# Patient Record
Sex: Male | Born: 1965
Health system: Southern US, Community
[De-identification: ages and names within clinical notes are randomized; demographics above are authoritative.]

## PROBLEM LIST (undated history)

## (undated) DIAGNOSIS — F419 Anxiety disorder, unspecified: Secondary | ICD-10-CM

## (undated) DIAGNOSIS — K829 Disease of gallbladder, unspecified: Secondary | ICD-10-CM

## (undated) DIAGNOSIS — K449 Diaphragmatic hernia without obstruction or gangrene: Secondary | ICD-10-CM

## (undated) DIAGNOSIS — J45909 Unspecified asthma, uncomplicated: Secondary | ICD-10-CM

## (undated) DIAGNOSIS — M1990 Unspecified osteoarthritis, unspecified site: Secondary | ICD-10-CM

## (undated) DIAGNOSIS — G4730 Sleep apnea, unspecified: Secondary | ICD-10-CM

## (undated) DIAGNOSIS — S0990XA Unspecified injury of head, initial encounter: Secondary | ICD-10-CM

## (undated) HISTORY — DX: Unspecified injury of head, initial encounter: S09.90XA

## (undated) HISTORY — DX: Disease of gallbladder, unspecified: K82.9

## (undated) HISTORY — DX: Unspecified osteoarthritis, unspecified site: M19.90

## (undated) HISTORY — PX: HERNIA REPAIR: SHX51

## (undated) HISTORY — DX: Unspecified asthma, uncomplicated: J45.909

## (undated) HISTORY — DX: Diaphragmatic hernia without obstruction or gangrene: K44.9

## (undated) HISTORY — PX: KNEE SURGERY: SHX244

## (undated) HISTORY — DX: Anxiety disorder, unspecified: F41.9

## (undated) HISTORY — DX: Sleep apnea, unspecified: G47.30

## (undated) HISTORY — PX: CHOLECYSTECTOMY: SHX55

---

## 2007-02-07 ENCOUNTER — Emergency Department: Payer: Self-pay | Admitting: Emergency Medicine

## 2010-03-06 ENCOUNTER — Emergency Department: Payer: Self-pay | Admitting: Emergency Medicine

## 2010-07-07 ENCOUNTER — Inpatient Hospital Stay: Payer: Self-pay | Admitting: Surgery

## 2014-05-14 ENCOUNTER — Emergency Department: Payer: Self-pay | Admitting: Emergency Medicine

## 2014-05-14 LAB — CBC WITH DIFFERENTIAL/PLATELET
BASOS ABS: 0 10*3/uL (ref 0.0–0.1)
Basophil %: 0.3 %
Eosinophil #: 0.1 10*3/uL (ref 0.0–0.7)
Eosinophil %: 0.9 %
HCT: 43.5 % (ref 40.0–52.0)
HGB: 14.7 g/dL (ref 13.0–18.0)
LYMPHS ABS: 2 10*3/uL (ref 1.0–3.6)
Lymphocyte %: 15 %
MCH: 32.6 pg (ref 26.0–34.0)
MCHC: 33.8 g/dL (ref 32.0–36.0)
MCV: 97 fL (ref 80–100)
MONO ABS: 1.1 x10 3/mm — AB (ref 0.2–1.0)
Monocyte %: 7.9 %
NEUTROS ABS: 10.3 10*3/uL — AB (ref 1.4–6.5)
NEUTROS PCT: 75.9 %
PLATELETS: 385 10*3/uL (ref 150–440)
RBC: 4.51 10*6/uL (ref 4.40–5.90)
RDW: 12.7 % (ref 11.5–14.5)
WBC: 13.5 10*3/uL — ABNORMAL HIGH (ref 3.8–10.6)

## 2014-05-14 LAB — COMPREHENSIVE METABOLIC PANEL
ALK PHOS: 81 U/L
ALT: 73 U/L — AB
ANION GAP: 10 (ref 7–16)
Albumin: 4.2 g/dL (ref 3.4–5.0)
BUN: 14 mg/dL (ref 7–18)
Bilirubin,Total: 0.2 mg/dL (ref 0.2–1.0)
CALCIUM: 8.4 mg/dL — AB (ref 8.5–10.1)
Chloride: 103 mmol/L (ref 98–107)
Co2: 25 mmol/L (ref 21–32)
Creatinine: 1.1 mg/dL (ref 0.60–1.30)
EGFR (African American): >60
EGFR (Non-African Amer.): >60
Glucose: 85 mg/dL (ref 65–99)
OSMOLALITY: 275 (ref 275–301)
POTASSIUM: 3.9 mmol/L (ref 3.5–5.1)
SGOT(AST): 37 U/L (ref 15–37)
Sodium: 138 mmol/L (ref 136–145)
TOTAL PROTEIN: 7.4 g/dL (ref 6.4–8.2)

## 2014-05-14 LAB — URINALYSIS, COMPLETE
BACTERIA: NONE SEEN
BLOOD: NEGATIVE
Bilirubin,UR: NEGATIVE
Glucose,UR: NEGATIVE mg/dL (ref 0–75)
Ketone: NEGATIVE
LEUKOCYTE ESTERASE: NEGATIVE
Nitrite: NEGATIVE
Ph: 5 (ref 4.5–8.0)
Protein: NEGATIVE
SPECIFIC GRAVITY: 1.019 (ref 1.003–1.030)
Squamous Epithelial: <1
WBC UR: 2 /HPF (ref 0–5)

## 2014-05-14 LAB — TROPONIN I

## 2014-05-14 LAB — LIPASE, BLOOD: LIPASE: 136 U/L (ref 73–393)

## 2014-05-14 LAB — D-DIMER(ARMC): D-Dimer: 364 ng/ml

## 2015-04-13 ENCOUNTER — Telehealth: Payer: Self-pay | Admitting: Pain Medicine

## 2015-04-13 ENCOUNTER — Encounter: Payer: Self-pay | Admitting: Pain Medicine

## 2015-04-13 NOTE — Telephone Encounter (Signed)
Patient is out of town on Monday 04-13-15 will need to resched

## 2015-04-14 ENCOUNTER — Encounter: Payer: Self-pay | Admitting: Pain Medicine

## 2015-04-14 NOTE — Telephone Encounter (Signed)
1. No need to inform me if the patient wants to reschedule an appointment. Please provide the patient with the most convenient alternative.  2. Please reschedule any patients calling us to inform us that they are sick. Please remember that all of our procedures are elective. Preferably, schedule the patient to return in 14-20 days.

## 2015-04-21 ENCOUNTER — Ambulatory Visit: Payer: Medicaid Other | Attending: Pain Medicine | Admitting: Pain Medicine

## 2015-04-21 ENCOUNTER — Encounter: Payer: Self-pay | Admitting: Pain Medicine

## 2015-04-21 VITALS — BP 133/81 | HR 78 | Temp 98.2°F | Resp 18 | Ht 72.0 in | Wt 249.0 lb

## 2015-04-21 DIAGNOSIS — K5900 Constipation, unspecified: Secondary | ICD-10-CM | POA: Diagnosis not present

## 2015-04-21 DIAGNOSIS — Z5181 Encounter for therapeutic drug level monitoring: Secondary | ICD-10-CM

## 2015-04-21 DIAGNOSIS — G8929 Other chronic pain: Secondary | ICD-10-CM | POA: Diagnosis not present

## 2015-04-21 DIAGNOSIS — F329 Major depressive disorder, single episode, unspecified: Secondary | ICD-10-CM | POA: Diagnosis not present

## 2015-04-21 DIAGNOSIS — M1710 Unilateral primary osteoarthritis, unspecified knee: Secondary | ICD-10-CM

## 2015-04-21 DIAGNOSIS — M129 Arthropathy, unspecified: Secondary | ICD-10-CM | POA: Diagnosis not present

## 2015-04-21 DIAGNOSIS — M545 Low back pain, unspecified: Secondary | ICD-10-CM

## 2015-04-21 DIAGNOSIS — F1120 Opioid dependence, uncomplicated: Secondary | ICD-10-CM | POA: Insufficient documentation

## 2015-04-21 DIAGNOSIS — M170 Bilateral primary osteoarthritis of knee: Secondary | ICD-10-CM | POA: Insufficient documentation

## 2015-04-21 DIAGNOSIS — M25562 Pain in left knee: Secondary | ICD-10-CM | POA: Diagnosis present

## 2015-04-21 DIAGNOSIS — M12869 Other specific arthropathies, not elsewhere classified, unspecified knee: Secondary | ICD-10-CM | POA: Diagnosis not present

## 2015-04-21 DIAGNOSIS — F32A Depression, unspecified: Secondary | ICD-10-CM | POA: Insufficient documentation

## 2015-04-21 DIAGNOSIS — F1190 Opioid use, unspecified, uncomplicated: Secondary | ICD-10-CM | POA: Diagnosis not present

## 2015-04-21 DIAGNOSIS — Z79891 Long term (current) use of opiate analgesic: Secondary | ICD-10-CM | POA: Insufficient documentation

## 2015-04-21 DIAGNOSIS — K5909 Other constipation: Secondary | ICD-10-CM | POA: Insufficient documentation

## 2015-04-21 DIAGNOSIS — M25561 Pain in right knee: Secondary | ICD-10-CM | POA: Diagnosis present

## 2015-04-21 DIAGNOSIS — M174 Other bilateral secondary osteoarthritis of knee: Secondary | ICD-10-CM

## 2015-04-21 MED ORDER — OXYCODONE HCL 5 MG PO TABS: 5.0000 mg | ORAL_TABLET | Freq: Four times a day (QID) | ORAL | Status: DC | PRN

## 2015-04-21 MED ORDER — GABAPENTIN 300 MG PO CAPS
300.0000 mg | ORAL_CAPSULE | Freq: Every day | ORAL | Status: DC
Start: 2015-04-21 — End: 2015-05-20

## 2015-04-21 MED ORDER — MELOXICAM 15 MG PO TABS: 15.0000 mg | ORAL_TABLET | Freq: Every day | ORAL | Status: DC

## 2015-04-21 NOTE — Progress Notes (Signed)
Hydrocodone #19 wasted in toilet with [place] and patient

## 2015-04-21 NOTE — Progress Notes (Signed)
Patient's Name: Nathan Hood MRN: [id number] DOB: 02/06/1966 DOS: 04/21/2015  Primary Reason(s) for Visit: Encounter for Medication Management. CC: Knee Pain   HPI:   Nathan Hood is a 49 y.o. year old, male patient, who returns today as an established patient. He has Chronic constipation; Chronic low back pain; Other bilateral secondary osteoarthritis of knee; Depression; Knee arthropathy; Encounter for therapeutic drug level monitoring; Long term current use of opiate analgesic; Uncomplicated opioid dependence ([place]); Opiate use; and Chronic pain on his problem list.. His primarily concern today is the Knee Pain    The patient indicates that recently he had intra-articular injections of Synvisc on both knees. He had all 3 injections and he feels that he did not get any benefit from them. At this point I have to assume that he has no more cartilage left. I asked him when his last MRI was in indicated that he was between 2008 and 2011. He indicates that his condition has worsened significantly since then. In view of this I will be repeating the MRI of both of his knees. I have also offered him genicular nerve blocks and if effective, we will consider RFA.  Pharmacotherapy Review: Side-effects or Adverse reactions: None reported. Effectiveness: Described as ineffective and would like to make some changes. Onset of action: Within expected pharmacological parameters. Duration of action: Within normal limits for medication. Peak effect: Timing and results are as within normal expected parameters. [place] PMP: Compliant with practice rules and regulations. DST: Compliant with practice rules and regulations. Lab work: No new labs ordered by our practice. Treatment compliance: Compliant. Substance Use Disorder (SUD) Risk Level: Low Planned course of action: Adjust therapy. Today we will discontinue the hydrocodone and start him on oxycodone.  Allergies: Nathan Hood has No Known  Allergies.  Meds: The patient has a current medication list which includes the following prescription(s): ibuprofen, meloxicam, terbinafine, gabapentin, and oxycodone. Requested Prescriptions   Signed Prescriptions Disp Refills  . oxyCODONE (OXY IR/ROXICODONE) 5 MG immediate release tablet 120 tablet 0    Sig: Take 1 tablet (5 mg total) by mouth every 6 (six) hours as needed for severe pain.  Marland Kitchen gabapentin (NEURONTIN) 300 MG capsule 30 capsule 0    Sig: Take 1 capsule (300 mg total) by mouth at bedtime.  . meloxicam (MOBIC) 15 MG tablet 30 tablet 0    Sig: Take 1 tablet (15 mg total) by mouth daily.    ROS: Constitutional: Afebrile, no chills, well hydrated and well nourished Gastrointestinal: negative Musculoskeletal:negative Neurological: negative Behavioral/Psych: negative  PFSH: Medical:  Nathan Hood  has a past medical history of Head injury; Asthma; Sleep apnea; Hiatal hernia; Gallbladder disease; Arthritis; and Anxiety. Family: family history includes Arthritis in his mother; Birth defects in his maternal grandfather; COPD in his mother; Stroke in his maternal grandfather. Surgical:  has past surgical history that includes Hernia repair; Knee surgery (Right); and Cholecystectomy. Tobacco:  reports that he has never smoked. He does not have any smokeless tobacco history on file. Alcohol:  reports that he drinks about 3.6 oz of alcohol per week. Drug:  reports that he does not use illicit drugs.  Physical Exam: Vitals:  Today's Vitals   04/21/15 1520 04/21/15 1522  BP:  133/81  Pulse: 78   Temp: 98.2 F (36.8 C)   Resp: 18   Height: 6' (1.829 m)   Weight: 249 lb (112.946 kg)   SpO2: 99%   PainSc: 9  6   Calculated BMI: Body mass  index is 33.76 kg/(m^2). General appearance: alert, cooperative, no distress and moderately obese Eyes: conjunctivae/corneas clear. PERRL, EOM's intact. Fundi benign. Lungs: No evidence respiratory distress, no audible rales or ronchi and  no use of accessory muscles of respiration Neck: no adenopathy, no carotid bruit, no JVD, supple, symmetrical, trachea midline and thyroid not enlarged, symmetric, no tenderness/mass/nodules Back: symmetric, no curvature. ROM normal. No CVA tenderness. Extremities: extremities normal, atraumatic, no cyanosis or edema Pulses: 2+ and symmetric Skin: Skin color, texture, turgor normal. No rashes or lesions Neurologic: Gait: Antalgic    Assessment: Encounter Diagnosis:  Primary Diagnosis: Knee arthropathy [M12.9]  Plan: Nathan Hood was seen today for knee pain.  Diagnoses and all orders for this visit:  Knee arthropathy -     GENICULAR NERVE BLOCK; Future -     MR Knee Left  Wo Contrast; Future -     MR Knee Right Wo Contrast; Future  Chronic pain -     oxyCODONE (OXY IR/ROXICODONE) 5 MG immediate release tablet; Take 1 tablet (5 mg total) by mouth every 6 (six) hours as needed for severe pain. -     gabapentin (NEURONTIN) 300 MG capsule; Take 1 capsule (300 mg total) by mouth at bedtime. -     meloxicam (MOBIC) 15 MG tablet; Take 1 tablet (15 mg total) by mouth daily.  Opiate use  Uncomplicated opioid dependence ([place])  Long term current use of opiate analgesic -     Drugs of abuse screen w/o alc, rtn urine-sln; Standing  Encounter for therapeutic drug level monitoring  Depression  Other bilateral secondary osteoarthritis of knee  Chronic low back pain  Chronic constipation     There are no Patient Instructions on file for this visit. Medications discontinued today:  Medications Discontinued During This Encounter  Medication Reason  . HYDROcodone-acetaminophen (NORCO/VICODIN) 5-325 MG tablet Discontinued by provider  . meloxicam (MOBIC) 15 MG tablet Reorder   Medications administered today:  Nathan Hood had no medications administered during this visit.  Primary Care Physician: No primary care provider on file. Location: [facility] Note  by: Jovon Streetman A. Dossie Arbour, M.D, DABA, DABAPM, DABPM, DABIPP, FIPP

## 2015-04-21 NOTE — Progress Notes (Signed)
Safety precautions to be maintained throughout the outpatient stay will include: orient to surroundings, keep bed in low position, maintain call bell within reach at all times, provide assistance with transfer out of bed and ambulation. Hydrocodone pill count #19/120

## 2015-05-11 ENCOUNTER — Other Ambulatory Visit: Payer: Self-pay | Admitting: Pain Medicine

## 2015-05-19 ENCOUNTER — Other Ambulatory Visit: Payer: Self-pay | Admitting: Pain Medicine

## 2015-05-20 ENCOUNTER — Encounter: Payer: Self-pay | Admitting: Pain Medicine

## 2015-05-20 ENCOUNTER — Ambulatory Visit: Payer: Medicaid Other | Attending: Pain Medicine | Admitting: Pain Medicine

## 2015-05-20 VITALS — BP 134/90 | HR 83 | Temp 97.7°F | Resp 16 | Ht 72.0 in | Wt 248.0 lb

## 2015-05-20 DIAGNOSIS — M25561 Pain in right knee: Secondary | ICD-10-CM

## 2015-05-20 DIAGNOSIS — F1190 Opioid use, unspecified, uncomplicated: Secondary | ICD-10-CM | POA: Insufficient documentation

## 2015-05-20 DIAGNOSIS — F419 Anxiety disorder, unspecified: Secondary | ICD-10-CM | POA: Diagnosis not present

## 2015-05-20 DIAGNOSIS — K5909 Other constipation: Secondary | ICD-10-CM | POA: Diagnosis not present

## 2015-05-20 DIAGNOSIS — M1710 Unilateral primary osteoarthritis, unspecified knee: Secondary | ICD-10-CM

## 2015-05-20 DIAGNOSIS — K5900 Constipation, unspecified: Secondary | ICD-10-CM

## 2015-05-20 DIAGNOSIS — J45909 Unspecified asthma, uncomplicated: Secondary | ICD-10-CM | POA: Diagnosis not present

## 2015-05-20 DIAGNOSIS — M170 Bilateral primary osteoarthritis of knee: Secondary | ICD-10-CM | POA: Diagnosis not present

## 2015-05-20 DIAGNOSIS — Z79899 Other long term (current) drug therapy: Secondary | ICD-10-CM

## 2015-05-20 DIAGNOSIS — Z5181 Encounter for therapeutic drug level monitoring: Secondary | ICD-10-CM

## 2015-05-20 DIAGNOSIS — M129 Arthropathy, unspecified: Secondary | ICD-10-CM

## 2015-05-20 DIAGNOSIS — G8929 Other chronic pain: Secondary | ICD-10-CM | POA: Insufficient documentation

## 2015-05-20 DIAGNOSIS — M545 Low back pain: Secondary | ICD-10-CM | POA: Insufficient documentation

## 2015-05-20 DIAGNOSIS — M174 Other bilateral secondary osteoarthritis of knee: Secondary | ICD-10-CM

## 2015-05-20 DIAGNOSIS — R52 Pain, unspecified: Secondary | ICD-10-CM | POA: Diagnosis present

## 2015-05-20 DIAGNOSIS — Z79891 Long term (current) use of opiate analgesic: Secondary | ICD-10-CM

## 2015-05-20 DIAGNOSIS — M25562 Pain in left knee: Secondary | ICD-10-CM

## 2015-05-20 DIAGNOSIS — F1120 Opioid dependence, uncomplicated: Secondary | ICD-10-CM

## 2015-05-20 MED ORDER — GABAPENTIN 300 MG PO CAPS: 600.0000 mg | ORAL_CAPSULE | Freq: Every day | ORAL | Status: DC

## 2015-05-20 MED ORDER — MELOXICAM 15 MG PO TABS: 15.0000 mg | ORAL_TABLET | Freq: Every day | ORAL | Status: DC

## 2015-05-20 MED ORDER — OXYCODONE HCL 5 MG PO TABS: 5.0000 mg | ORAL_TABLET | Freq: Four times a day (QID) | ORAL | Status: DC | PRN

## 2015-05-20 NOTE — Patient Instructions (Signed)
A prescription for MELOXICAM, GABAPENTIN was sent to your pharmacy and should be available for pickup today.  A prescription for OXYCODONE was given to you today.

## 2015-05-20 NOTE — Progress Notes (Signed)
Patient's Name: Nathan Hood MRN: [id number] DOB: 1965/11/05 DOS: 05/20/2015  Primary Reason(s) for Visit: Encounter for Medication Management. CC: Pain   HPI:   Nathan Hood is a 49 y.o. year old, male patient, who returns today as an established patient. He has Chronic constipation; Chronic low back pain; Depression; Knee arthropathy; Encounter for therapeutic drug level monitoring; Long term current use of opiate analgesic; Uncomplicated opioid dependence ([place]); Opiate use; Chronic pain; Long term prescription opiate use; Chronic bilateral knee pain; and Bilateral knee osteoarthritis on his problem list.. His primarily concern today is the Pain     The patient returns today for follow-up evaluation. Unfortunately, the MRIs that we ordered have not been done. This appears to be an issue with Medicaid. Today we will go ahead and order some x-rays of the knees to see if what holding this is the fact that we don't have any recent x-rays. The patient did have some x-rays done, but he indicates that they were done in 2008.  Today's Pain Score: 8 . Clinically he looks like at 2-3/10. Reported level of pain is incompatible with clinical obrservations. This may be secondary to a possible lack of understanding on how the pain scale works. Pain Type: Chronic pain Pain Location: Knee Pain Orientation: Right, Left Pain Descriptors / Indicators: Aching Pain Frequency: Constant  Date of Last Visit: Date of Last Visit: 04/21/15 Service Provided on Last Visit: Service Provided on Last Visit: Med Refill  Pharmacotherapy Review:   Side-effects or Adverse reactions: None reported. Effectiveness: Described as relatively effective, allowing for increase in activities of daily living (ADL). Onset of action: Within expected pharmacological parameters. Duration of action: Within normal limits for medication. Peak effect: Timing and results are as within normal expected parameters. Nathan Hood PMP: Compliant with  practice rules and regulations. Last UDS available in the system: UDS: Compliant with practice rules and regulations. Lab work: No new labs ordered by our practice. Treatment compliance: Compliant. Substance Use Disorder (SUD) Risk Level: Low Planned course of action: Today we will adjust the dose on his gabapentin to 600 mg by mouth at bedtime.  We have offered to this patient diagnostic genicular nerve blocks, but he is still thinking about it.  Allergies: Nathan Hood has No Known Allergies.  Meds: The patient has a current medication list which includes the following prescription(s): gabapentin, ibuprofen, meloxicam, oxycodone, and terbinafine. Requested Prescriptions   Signed Prescriptions Disp Refills  . gabapentin (NEURONTIN) 300 MG capsule 60 capsule 0    Sig: Take 2 capsules (600 mg total) by mouth at bedtime.  . meloxicam (MOBIC) 15 MG tablet 30 tablet 0    Sig: Take 1 tablet (15 mg total) by mouth daily.  Marland Kitchen oxyCODONE (OXY IR/ROXICODONE) 5 MG immediate release tablet 120 tablet 0    Sig: Take 1 tablet (5 mg total) by mouth every 6 (six) hours as needed for severe pain.    ROS: Constitutional: Afebrile, no chills, well hydrated and well nourished Gastrointestinal: negative Musculoskeletal:negative Neurological: negative Behavioral/Psych: negative  PFSH: Medical:  Nathan Hood  has a past medical history of Head injury; Asthma; Sleep apnea; Hiatal hernia; Gallbladder disease; Arthritis; and Anxiety. Family: family history includes Arthritis in his mother; Birth defects in his maternal grandfather; COPD in his mother; Stroke in his maternal grandfather. Surgical:  has past surgical history that includes Hernia repair; Knee surgery (Right); and Cholecystectomy. Tobacco:  reports that he has never smoked. He does not have any smokeless tobacco history on file. Alcohol:  reports that he drinks about 3.6 oz of alcohol per week. Drug:  reports that he does not use illicit  drugs.  Physical Exam: Vitals:  Today's Vitals   05/20/15 1339  BP: 134/90  Pulse: 83  Temp: 97.7 F (36.5 C)  TempSrc: Oral  Resp: 16  Height: 6' (1.829 m)  Weight: 248 lb (112.492 kg)  SpO2: 99%  PainSc: 8   PainLoc: Knee  Calculated BMI: Body mass index is 33.63 kg/(m^2). General appearance: alert, cooperative, appears stated age and no distress Eyes: conjunctivae/corneas clear. PERRL, EOM's intact. Fundi benign. Lungs: No evidence respiratory distress, no audible rales or ronchi and no use of accessory muscles of respiration Neck: no adenopathy, no carotid bruit, no JVD, supple, symmetrical, trachea midline and thyroid not enlarged, symmetric, no tenderness/mass/nodules Back: symmetric, no curvature. ROM normal. No CVA tenderness. Extremities: extremities normal, atraumatic, no cyanosis or edema Pulses: 2+ and symmetric Skin: Skin color, texture, turgor normal. No rashes or lesions Neurologic: Grossly normal    Assessment: Encounter Diagnosis:  Primary Diagnosis: Primary osteoarthritis of both knees [M17.0]  Plan: [name] was seen today for pain.  Diagnoses and all orders for this visit:  Primary osteoarthritis of both knees  Long term prescription opiate use  Bilateral chronic knee pain -     DG Knee Complete 4 Views Right; Future -     DG Knee Complete 4 Views Left; Future  Chronic pain -     COMPLETE METABOLIC PANEL WITH GFR; Future -     C-reactive protein; Future -     Magnesium; Future -     Sedimentation rate; Future -     Vitamin D2,D3 Panel; Future -     gabapentin (NEURONTIN) 300 MG capsule; Take 2 capsules (600 mg total) by mouth at bedtime. -     meloxicam (MOBIC) 15 MG tablet; Take 1 tablet (15 mg total) by mouth daily. -     oxyCODONE (OXY IR/ROXICODONE) 5 MG immediate release tablet; Take 1 tablet (5 mg total) by mouth every 6 (six) hours as needed for severe pain.  Chronic low back pain  Knee arthropathy  Other bilateral secondary  osteoarthritis of knee  Encounter for therapeutic drug level monitoring  Long term current use of opiate analgesic  Opiate use  Uncomplicated opioid dependence ([place])  Chronic constipation     Patient Instructions  A prescription for MELOXICAM, GABAPENTIN was sent to your pharmacy and should be available for pickup today.  A prescription for OXYCODONE was given to you today.   Medications discontinued today:  Medications Discontinued During This Encounter  Medication Reason  . gabapentin (NEURONTIN) 300 MG capsule Reorder  . meloxicam (MOBIC) 15 MG tablet Reorder  . oxyCODONE (OXY IR/ROXICODONE) 5 MG immediate release tablet Reorder   Medications administered today:  Mr. Wickes had no medications administered during this visit.  Primary Care Physician: No primary care provider on file. Location: [facility] Note by: Kindrick Lankford A. Dossie Arbour, M.D, DABA, DABAPM, DABPM, DABIPP, FIPP

## 2015-05-20 NOTE — Progress Notes (Signed)
Pill Count: Oxycodone 5 mg - # 37

## 2015-05-21 ENCOUNTER — Ambulatory Visit
Admission: RE | Admit: 2015-05-21 | Discharge: 2015-05-21 | Disposition: A | Discharge status: Home / Self Care | Payer: Medicaid Other | Source: Ambulatory Visit | Attending: Pain Medicine | Admitting: Pain Medicine

## 2015-05-21 DIAGNOSIS — G8929 Other chronic pain: Secondary | ICD-10-CM | POA: Diagnosis present

## 2015-05-21 DIAGNOSIS — M25562 Pain in left knee: Secondary | ICD-10-CM | POA: Diagnosis present

## 2015-05-21 DIAGNOSIS — M170 Bilateral primary osteoarthritis of knee: Secondary | ICD-10-CM | POA: Diagnosis not present

## 2015-05-21 DIAGNOSIS — M25561 Pain in right knee: Principal | ICD-10-CM

## 2015-06-09 ENCOUNTER — Other Ambulatory Visit: Payer: Self-pay | Admitting: Pain Medicine

## 2015-06-17 ENCOUNTER — Other Ambulatory Visit: Payer: Self-pay | Admitting: Pain Medicine

## 2015-06-17 ENCOUNTER — Encounter: Payer: Self-pay | Admitting: Pain Medicine

## 2015-06-17 ENCOUNTER — Ambulatory Visit: Payer: Medicaid Other | Attending: Pain Medicine | Admitting: Pain Medicine

## 2015-06-17 VITALS — BP 138/87 | HR 74 | Temp 98.0°F | Resp 16 | Ht 72.0 in | Wt 248.0 lb

## 2015-06-17 DIAGNOSIS — G8929 Other chronic pain: Secondary | ICD-10-CM | POA: Insufficient documentation

## 2015-06-17 DIAGNOSIS — M25562 Pain in left knee: Secondary | ICD-10-CM | POA: Diagnosis not present

## 2015-06-17 DIAGNOSIS — M170 Bilateral primary osteoarthritis of knee: Secondary | ICD-10-CM | POA: Insufficient documentation

## 2015-06-17 DIAGNOSIS — M129 Arthropathy, unspecified: Secondary | ICD-10-CM

## 2015-06-17 DIAGNOSIS — Z79891 Long term (current) use of opiate analgesic: Secondary | ICD-10-CM

## 2015-06-17 DIAGNOSIS — M25561 Pain in right knee: Secondary | ICD-10-CM | POA: Insufficient documentation

## 2015-06-17 DIAGNOSIS — F1190 Opioid use, unspecified, uncomplicated: Secondary | ICD-10-CM

## 2015-06-17 DIAGNOSIS — M25569 Pain in unspecified knee: Secondary | ICD-10-CM | POA: Diagnosis present

## 2015-06-17 DIAGNOSIS — F1120 Opioid dependence, uncomplicated: Secondary | ICD-10-CM | POA: Diagnosis not present

## 2015-06-17 DIAGNOSIS — Z79899 Other long term (current) drug therapy: Secondary | ICD-10-CM

## 2015-06-17 DIAGNOSIS — M1710 Unilateral primary osteoarthritis, unspecified knee: Secondary | ICD-10-CM

## 2015-06-17 DIAGNOSIS — Z5181 Encounter for therapeutic drug level monitoring: Secondary | ICD-10-CM

## 2015-06-17 MED ORDER — OXYCODONE HCL 5 MG PO TABS: 5.0000 mg | ORAL_TABLET | Freq: Four times a day (QID) | ORAL | Status: DC | PRN

## 2015-06-17 MED ORDER — MELOXICAM 15 MG PO TABS: 15.0000 mg | ORAL_TABLET | Freq: Every day | ORAL | Status: DC

## 2015-06-17 MED ORDER — GABAPENTIN 300 MG PO CAPS: 600.0000 mg | ORAL_CAPSULE | Freq: Every day | ORAL | Status: DC

## 2015-06-17 NOTE — Progress Notes (Signed)
Safety precautions to be maintained throughout the outpatient stay will include: orient to surroundings, keep bed in low position, maintain call bell within reach at all times, provide assistance with transfer out of bed and ambulation.  

## 2015-06-17 NOTE — Progress Notes (Signed)
Patient's Name: Nathan Hood MRN: [id number] DOB: 1966-02-14 DOS: 06/17/2015  Primary Reason(s) for Visit: Encounter for Medication Management CC: Knee Pain   HPI:   Mr. Nathan Hood is a 49 y.o. year old, male patient, who returns today as an established patient. He has Chronic constipation; Chronic low back pain; Depression; Knee Arthropathy (Bilateral); Encounter for therapeutic drug level monitoring; Long term current use of opiate analgesic; Uncomplicated opioid dependence ([place]); Opiate use; Chronic pain; Long term prescription opiate use; Chronic knee pain (Location of Primary Source of Pain) (Bilateral) (R>L); Bilateral knee osteoarthritis (Severe) (R>L); and Knee Arthralgia  (Bilateral) on his problem list.. His primarily concern today is the Knee Pain     The patient comes into the clinic today for pharmacological management of his chronic bilateral knee pain. The x-rays came back positive for severe osteoarthritis of the knees. At this point, I need to order MRIs to then be able to run a primary orthopedic surgeons to see if they think he may be a candidate for joint replacement. With rest of his medications, he is doing well with no side effects.  Today's Pain Score: 9 , clinically he looks more like a 3-4/10. Reported level of pain is incompatible with clinical obrservations. This may be secondary to a possible lack of understanding on how the pain scale works. Pain Descriptors / Indicators: Nagging, Burning, Sharp Pain Frequency: Constant  Date of Last Visit: 05/20/15 Service Provided on Last Visit: Procedure  Pharmacotherapy Review:   Side-effects or Adverse reactions: None reported Effectiveness: Described as relatively effective, allowing for increase in activities of daily living (ADL) Onset of action: Within expected pharmacological parameters Duration of action: Within normal limits for medication Peak effect: Timing and results are as within normal expected parameters [place]  PMP: Compliant with practice rules and regulations UDS Results:   04/21/2015 UDS results came back within normal limits. He remains compliant. UDS Interpretation: Patient appears to be compliant with practice rules and regulations Medication Assessment Form: Reviewed. Patient indicates being compliant with therapy Treatment compliance: Compliant Substance Use Disorder (SUD) Risk Level: Low Pharmacologic Plan: Continue therapy as is  Lab Work: Inflammation Markers No results found for: ESRSEDRATE, CRP  Renal Function Lab Results  Component Value Date   BUN 14 05/14/2014   CREATININE 1.10 05/14/2014   GFRAA >60 05/14/2014   GFRNONAA >60 05/14/2014    Hepatic Function Lab Results  Component Value Date   AST 37 05/14/2014   ALT 73* 05/14/2014   ALBUMIN 4.2 05/14/2014    Electrolytes Lab Results  Component Value Date   NA 138 05/14/2014   K 3.9 05/14/2014   CL 103 05/14/2014   CALCIUM 8.4* [id number]    Illicit Drugs No results found for: THCU, COCAINSCRNUR, PCPSCRNUR, MDMA, AMPHETMU, METHADONE, ETOH  Allergies: Mr. Nathan Hood has No Known Allergies.  Meds: The patient has a current medication list which includes the following prescription(s): gabapentin, meloxicam, oxycodone, terbinafine, and oxycodone. Requested Prescriptions   Signed Prescriptions Disp Refills  . gabapentin (NEURONTIN) 300 MG capsule 60 capsule 1    Sig: Take 2 capsules (600 mg total) by mouth at bedtime.  . meloxicam (MOBIC) 15 MG tablet 30 tablet 1    Sig: Take 1 tablet (15 mg total) by mouth daily.  Marland Kitchen oxyCODONE (OXY IR/ROXICODONE) 5 MG immediate release tablet 120 tablet 0    Sig: Take 1 tablet (5 mg total) by mouth every 6 (six) hours as needed for moderate pain or severe pain.  Marland Kitchen oxyCODONE (OXY  IR/ROXICODONE) 5 MG immediate release tablet 120 tablet 0    Sig: Take 1 tablet (5 mg total) by mouth every 6 (six) hours as needed for moderate pain or severe pain.    ROS: Constitutional:  Afebrile, no chills, well hydrated and well nourished Gastrointestinal: negative Musculoskeletal:negative Neurological: negative Behavioral/Psych: negative  PFSH: Medical:  Mr. Nathan Hood  has a past medical history of Head injury; Asthma; Sleep apnea; Hiatal hernia; Gallbladder disease; Arthritis; and Anxiety. Family: family history includes Arthritis in his mother; Birth defects in his maternal grandfather; COPD in his mother; Stroke in his maternal grandfather. Surgical:  has past surgical history that includes Hernia repair; Knee surgery (Right); and Cholecystectomy. Tobacco:  reports that he has never smoked. He does not have any smokeless tobacco history on file. Alcohol:  reports that he drinks about 3.6 oz of alcohol per week. Drug:  reports that he does not use illicit drugs.  Physical Exam: Vitals:  Today's Vitals   06/17/15 1509 06/17/15 1510  BP: 138/87   Pulse: 74   Temp: 98 F (36.7 C)   TempSrc: Oral   Resp: 16   Height: 6' (1.829 m)   Weight: 248 lb (112.492 kg)   SpO2: 98%   PainSc:  9   Calculated BMI: Body mass index is 33.63 kg/(m^2). General appearance: alert, cooperative, appears stated age and no distress Eyes: PERLA Respiratory: No evidence respiratory distress, no audible rales or ronchi and no use of accessory muscles of respiration Neck: no adenopathy, no carotid bruit, no JVD, supple, symmetrical, trachea midline and thyroid not enlarged, symmetric, no tenderness/mass/nodules  Cervical Spine ROM: Adequate for flexion, extension, rotation, and lateral bending Palpation: No palpable trigger points  Upper Extremities ROM: Adequate bilaterally Strength: 5/5 for all flexors and extensors of the upper extremity, bilaterally Pulses: Palpable bilaterally Neurologic: No allodynia, No hyperesthesia, No hyperpathia and No sensory abnormalities  Lumbar Spine ROM: Adequate for flexion, extension, rotation, and lateral bending Palpation: No palpable trigger  points Lumbar Hyperextension and rotation: Non-contributory Patrick's Maneuver: Non-contributory  Lower Extremities ROM: Adequate bilaterally Strength: 5/5 for all flexors and extensors of the lower extremity, bilaterally Pulses: Palpable bilaterally Neurologic: No allodynia, No hyperesthesia, No hyperpathia, No sensory abnormalities and No antalgic gait or posture  Assessment: Encounter Diagnosis:  Primary Diagnosis: Bilateral chronic knee pain [M25.561, M25.562, G89.29]  Plan: Interventional Therapies: PRN procedure: Bilateral Hyalgan knee injections versus Genicular nerve blocks.  Narcisse was seen today for knee pain.  Diagnoses and all orders for this visit:  Chronic knee pain (Location of Primary Source of Pain) (Bilateral) (R>L) -     MR Knee Left  Wo Contrast; Future -     MR Knee Right Wo Contrast; Future  Primary osteoarthritis of both knees -     meloxicam (MOBIC) 15 MG tablet; Take 1 tablet (15 mg total) by mouth daily. -     MR Knee Left  Wo Contrast; Future -     MR Knee Right Wo Contrast; Future  Uncomplicated opioid dependence ([facility])  Opiate use  Long term prescription opiate use  Long term current use of opiate analgesic  Encounter for therapeutic drug level monitoring  Chronic pain -     gabapentin (NEURONTIN) 300 MG capsule; Take 2 capsules (600 mg total) by mouth at bedtime. -     meloxicam (MOBIC) 15 MG tablet; Take 1 tablet (15 mg total) by mouth daily. -     oxyCODONE (OXY IR/ROXICODONE) 5 MG immediate release tablet; Take 1 tablet (  5 mg total) by mouth every 6 (six) hours as needed for moderate pain or severe pain. -     oxyCODONE (OXY IR/ROXICODONE) 5 MG immediate release tablet; Take 1 tablet (5 mg total) by mouth every 6 (six) hours as needed for moderate pain or severe pain.  Knee Arthralgia  (Bilateral)  Knee arthropathy (Bilateral)     There are no Patient Instructions on file for this visit. Medications discontinued today:  Medications  Discontinued During This Encounter  Medication Reason  . gabapentin (NEURONTIN) 300 MG capsule Reorder  . meloxicam (MOBIC) 15 MG tablet Reorder  . oxyCODONE (OXY IR/ROXICODONE) 5 MG immediate release tablet Reorder  . ibuprofen (ADVIL,MOTRIN) 200 MG tablet Error   Medications administered today:  Mr. Nosbisch had no medications administered during this visit.  Primary Care Physician: Volanda Napoleon, MD Location: [facility] Note by: Kathlen Brunswick. Dossie Arbour, M.D, DABA, DABAPM, DABPM, DABIPP, FIPP

## 2015-06-17 NOTE — Progress Notes (Signed)
#  53 remaining Oxycodone 5 mg

## 2015-06-18 ENCOUNTER — Other Ambulatory Visit: Payer: Self-pay | Admitting: Pain Medicine

## 2015-06-19 ENCOUNTER — Other Ambulatory Visit: Payer: Self-pay

## 2015-06-21 LAB — SPECIMEN STATUS REPORT

## 2015-06-21 LAB — TOXASSURE SELECT 13 (MW), URINE: PDF: 0

## 2015-06-26 ENCOUNTER — Other Ambulatory Visit: Payer: Self-pay | Admitting: Pain Medicine

## 2015-08-03 ENCOUNTER — Ambulatory Visit (HOSPITAL_COMMUNITY)
Admission: RE | Admit: 2015-08-03 | Discharge: 2015-08-03 | Disposition: A | Discharge status: Home / Self Care | Payer: Medicaid Other | Source: Ambulatory Visit | Attending: Pain Medicine | Admitting: Pain Medicine

## 2015-08-03 ENCOUNTER — Other Ambulatory Visit: Payer: Self-pay | Admitting: Pain Medicine

## 2015-08-03 DIAGNOSIS — X58XXXA Exposure to other specified factors, initial encounter: Secondary | ICD-10-CM | POA: Insufficient documentation

## 2015-08-03 DIAGNOSIS — S83241A Other tear of medial meniscus, current injury, right knee, initial encounter: Secondary | ICD-10-CM | POA: Insufficient documentation

## 2015-08-03 DIAGNOSIS — M659 Synovitis and tenosynovitis, unspecified: Secondary | ICD-10-CM | POA: Insufficient documentation

## 2015-08-03 DIAGNOSIS — M25562 Pain in left knee: Principal | ICD-10-CM

## 2015-08-03 DIAGNOSIS — S83232A Complex tear of medial meniscus, current injury, left knee, initial encounter: Secondary | ICD-10-CM | POA: Diagnosis not present

## 2015-08-03 DIAGNOSIS — M170 Bilateral primary osteoarthritis of knee: Secondary | ICD-10-CM

## 2015-08-03 DIAGNOSIS — M25561 Pain in right knee: Principal | ICD-10-CM

## 2015-08-03 DIAGNOSIS — Z77018 Contact with and (suspected) exposure to other hazardous metals: Secondary | ICD-10-CM | POA: Insufficient documentation

## 2015-08-03 DIAGNOSIS — G8929 Other chronic pain: Secondary | ICD-10-CM

## 2015-08-03 DIAGNOSIS — M25461 Effusion, right knee: Secondary | ICD-10-CM | POA: Insufficient documentation

## 2015-08-03 DIAGNOSIS — M67462 Ganglion, left knee: Secondary | ICD-10-CM | POA: Insufficient documentation

## 2015-08-03 DIAGNOSIS — M179 Osteoarthritis of knee, unspecified: Secondary | ICD-10-CM | POA: Insufficient documentation

## 2015-08-07 ENCOUNTER — Ambulatory Visit: Payer: Medicaid Other

## 2015-08-07 ENCOUNTER — Other Ambulatory Visit: Payer: Medicaid Other

## 2015-08-13 ENCOUNTER — Other Ambulatory Visit: Payer: Self-pay | Admitting: Pain Medicine

## 2015-08-15 NOTE — Progress Notes (Signed)
Quick Note:  This imaging study has been reviewed and not found to have any major pathology requiring urgent or emergency care. Imaging reveals: Right Knee MRI -  1. Fairly extensive horizontal flap type tear involving the mid body region of the medial meniscus with a flipped meniscal fragment in the medial gutter. 2. Significant tricompartmental degenerative changes most notable in the medial compartment. 3. Moderate-sized joint effusion and severe synovitis with numerous loose bodies in the joint. 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 4. Intact ligamentous structures.  Orthopedic Consult required. ______

## 2015-08-15 NOTE — Progress Notes (Signed)
Quick Note:  This imaging study has been reviewed and not found to have any major pathology requiring urgent or emergency care. Imaging reveals: Left Knee MRI -  Dominant finding is markedly advanced for age tricompartmental osteoarthritis appearing worst medially where it is severe. Associated complex tearing throughout the posterior horn of medial meniscus is identified.  Degenerative change proximal tib-fib joint with a small synovial or ganglion cyst emanating off the anterior, inferior margin of the joint.  Orthopedic Consult required. ______

## 2015-08-17 ENCOUNTER — Ambulatory Visit: Payer: Medicaid Other | Attending: Pain Medicine | Admitting: Pain Medicine

## 2015-08-17 ENCOUNTER — Encounter: Payer: Self-pay | Admitting: Pain Medicine

## 2015-08-17 ENCOUNTER — Other Ambulatory Visit: Payer: Self-pay | Admitting: Pain Medicine

## 2015-08-17 VITALS — BP 137/92 | HR 78 | Temp 97.7°F | Resp 18 | Ht 72.0 in | Wt 249.0 lb

## 2015-08-17 DIAGNOSIS — Z9049 Acquired absence of other specified parts of digestive tract: Secondary | ICD-10-CM | POA: Diagnosis not present

## 2015-08-17 DIAGNOSIS — G8929 Other chronic pain: Secondary | ICD-10-CM | POA: Diagnosis not present

## 2015-08-17 DIAGNOSIS — K5909 Other constipation: Secondary | ICD-10-CM | POA: Diagnosis not present

## 2015-08-17 DIAGNOSIS — F1190 Opioid use, unspecified, uncomplicated: Secondary | ICD-10-CM | POA: Insufficient documentation

## 2015-08-17 DIAGNOSIS — M545 Low back pain: Secondary | ICD-10-CM | POA: Diagnosis not present

## 2015-08-17 DIAGNOSIS — M25562 Pain in left knee: Secondary | ICD-10-CM | POA: Insufficient documentation

## 2015-08-17 DIAGNOSIS — M25561 Pain in right knee: Secondary | ICD-10-CM | POA: Insufficient documentation

## 2015-08-17 DIAGNOSIS — Z5181 Encounter for therapeutic drug level monitoring: Secondary | ICD-10-CM

## 2015-08-17 DIAGNOSIS — F329 Major depressive disorder, single episode, unspecified: Secondary | ICD-10-CM | POA: Insufficient documentation

## 2015-08-17 DIAGNOSIS — M25569 Pain in unspecified knee: Secondary | ICD-10-CM | POA: Diagnosis present

## 2015-08-17 DIAGNOSIS — F419 Anxiety disorder, unspecified: Secondary | ICD-10-CM | POA: Diagnosis not present

## 2015-08-17 DIAGNOSIS — M25539 Pain in unspecified wrist: Secondary | ICD-10-CM | POA: Diagnosis present

## 2015-08-17 DIAGNOSIS — Z79891 Long term (current) use of opiate analgesic: Secondary | ICD-10-CM | POA: Diagnosis not present

## 2015-08-17 DIAGNOSIS — M170 Bilateral primary osteoarthritis of knee: Secondary | ICD-10-CM | POA: Diagnosis not present

## 2015-08-17 LAB — COMPREHENSIVE METABOLIC PANEL
ALK PHOS: 67 U/L (ref 38–126)
ALT: 46 U/L (ref 17–63)
AST: 32 U/L (ref 15–41)
Albumin: 4.2 g/dL (ref 3.5–5.0)
Anion gap: 5 (ref 5–15)
BILIRUBIN TOTAL: 0.9 mg/dL (ref 0.3–1.2)
BUN: 19 mg/dL (ref 6–20)
CALCIUM: 8.9 mg/dL (ref 8.9–10.3)
CO2: 27 mmol/L (ref 22–32)
CREATININE: 1.02 mg/dL (ref 0.61–1.24)
Chloride: 104 mmol/L (ref 101–111)
Glucose, Bld: 135 mg/dL — ABNORMAL HIGH (ref 65–99)
Potassium: 4 mmol/L (ref 3.5–5.1)
Sodium: 136 mmol/L (ref 135–145)
Total Protein: 6.9 g/dL (ref 6.5–8.1)

## 2015-08-17 LAB — MAGNESIUM: MAGNESIUM: 2 mg/dL (ref 1.7–2.4)

## 2015-08-17 LAB — SEDIMENTATION RATE: Sed Rate: 8 mm/hr (ref 0–15)

## 2015-08-17 LAB — C-REACTIVE PROTEIN: CRP: 0.6 mg/dL (ref <1.0)

## 2015-08-17 MED ORDER — GABAPENTIN 300 MG PO CAPS: 600.0000 mg | ORAL_CAPSULE | Freq: Every day | ORAL | Status: DC

## 2015-08-17 MED ORDER — OXYCODONE HCL 5 MG PO TABS: 5.0000 mg | ORAL_TABLET | Freq: Four times a day (QID) | ORAL | Status: DC | PRN

## 2015-08-17 MED ORDER — MELOXICAM 15 MG PO TABS: 15.0000 mg | ORAL_TABLET | Freq: Every day | ORAL | Status: DC

## 2015-08-17 NOTE — Progress Notes (Signed)
Safety precautions to be maintained throughout the outpatient stay will include: orient to surroundings, keep bed in low position, maintain call bell within reach at all times, provide assistance with transfer out of bed and ambulation.  Would like something for constipation-- here today for med refill Review MRI results meds remaining oxycodone 5mg  101/120 filled 08/13/15

## 2015-08-17 NOTE — Progress Notes (Signed)
Patient's Name: Nathan Hood MRN: [id number] DOB: [birth year] DOS: 08/17/2015  Primary Reason(s) for Visit: Encounter for Medication Management CC: Knee Pain and Wrist Pain   HPI  [name] is a 50 y.o. year old, male patient, who returns today as an established patient. He has Chronic constipation; Chronic low back pain; Depression; Knee Arthropathy (Bilateral); Encounter for therapeutic drug level monitoring; Long term current use of opiate analgesic; Uncomplicated opioid dependence ([place]); Opiate use; Chronic pain; Long term prescription opiate use; Chronic knee pain (Location of Primary Source of Pain) (Bilateral) (R>L); Bilateral knee osteoarthritis (Severe) (R>L); and Knee Arthralgia  (Bilateral) on his problem list.. His primarily concern today is the Knee Pain and Wrist Pain   The patient returns to the clinics today for pharmacological management of his chronic pain. Today we went over the results of both of his knee MRIs. Based on the pathology observed, we have arranged for him to have a consult with [facility] orthopedics (Dr. Bertrum Sol bank) for evaluation and treatment of his bilateral knee arthropathy.  Reported Pain Score: 9  Reported level is inconsistent with clinical obrservations. Pain Location: Knee Pain Orientation: Right, Left Pain Descriptors / Indicators: Aching, Constant, Tiring  Date of Last Visit: 06/17/15 Service Provided on Last Visit: Med Refill  Pharmacotherapy  Medication(s): Oxycodone IR 5 mg 1 tablet by mouth every 6 hours when necessary for pain. Onset of action: Within expected pharmacological parameters. (30 minutes) Time to Peak effect: Timing and results are as within normal expected parameters. (One hour) Analgesic Effect: 35-40% Activity Facilitation: Medication(s) allow patient to sit, stand, walk, and do the basic ADLs Perceived Effectiveness: Described as relatively effective, allowing for increase in activities of daily living (ADL) Side-effects  or Adverse reactions: None reported Duration of action: Within normal limits for medication. (2.5 hours) [place] PMP: Compliant with practice rules and regulations UDS Results: Last UDS done on 06/17/2015 came back within normal limits with no unexpected results. The urinary creatinine was a little low perhaps suggesting dilution. UDS Interpretation: Patient appears to be compliant with practice rules and regulations Medication Assessment Form: Reviewed. Patient indicates being compliant with therapy Treatment compliance: Compliant Substance Use Disorder (SUD) Risk Level: Low Pharmacologic Plan: Continue therapy as is  Lab Work: Illicit Drugs No results found for: THCU, COCAINSCRNUR, PCPSCRNUR, MDMA, AMPHETMU, METHADONE, ETOH  Inflammation Markers No results found for: ESRSEDRATE, CRP  Renal Function Lab Results  Component Value Date   BUN 14 05/14/2014   CREATININE 1.10 05/14/2014   GFRAA >60 05/14/2014   GFRNONAA >60 05/14/2014    Hepatic Function Lab Results  Component Value Date   AST 37 05/14/2014   ALT 73* 05/14/2014   ALBUMIN 4.2 05/14/2014    Electrolytes Lab Results  Component Value Date   NA 138 05/14/2014   K 3.9 05/14/2014   CL 103 05/14/2014   CALCIUM 8.4* 05/14/2014    Allergies  [name] has No Known Allergies.  Meds  The patient has a current medication list which includes the following prescription(s): gabapentin, glucosamine-chondroitin, meloxicam, oxycodone, and oxycodone.  No current outpatient prescriptions on file prior to visit.   No current facility-administered medications on file prior to visit.    ROS  Constitutional: Afebrile, no chills, well hydrated and well nourished Gastrointestinal: negative Musculoskeletal:negative Neurological: negative Behavioral/Psych: negative  PFSH  Medical:  [name]  has a past medical history of Head injury; Asthma; Sleep apnea; Hiatal hernia; Gallbladder disease; Arthritis; and  Anxiety. Family: family history includes Arthritis in his  mother; Birth defects in his maternal grandfather; COPD in his mother; Stroke in his maternal grandfather. Surgical:  has past surgical history that includes Hernia repair; Knee surgery (Right); and Cholecystectomy. Tobacco:  reports that he has never smoked. He does not have any smokeless tobacco history on file. Alcohol:  reports that he drinks about 3.6 oz of alcohol per week. Drug:  reports that he does not use illicit drugs.  Physical Exam  Vitals:  Today's Vitals   08/17/15 1053 08/17/15 1054  BP: 137/92   Pulse: 78   Temp: 97.7 F (36.5 C)   TempSrc: Oral   Resp: 18   Height: 6' (1.829 m)   Weight: 249 lb (112.946 kg)   SpO2: 97%   PainSc:  9     Calculated BMI: Body mass index is 33.76 kg/(m^2).  General appearance: alert, cooperative, appears stated age and mild distress Eyes: PERLA Respiratory: No evidence respiratory distress, no audible rales or ronchi and no use of accessory muscles of respiration  Cervical Spine Inspection: Normal anatomy Alignment: Symetrical ROM: Adequate Palpation: WNL  Upper Extremities Inspection: No gross anomalies detected ROM: Adequate Sensory: Normal Motor: Unremarkable  Negative Phalen's and Tinel sign, bilaterally.  Thoracic Spine Inspection: No gross anomalies detected Alignment: Symetrical ROM: Adequate Palpation: WNL  Lumbar Spine Inspection: No gross anomalies detected Alignment: Symetrical ROM: Adequate Palpation: WNL Gait: Antalgic (limping)  Lower Extremities Inspection: No gross anomalies detected ROM: Decreased bilaterally for the knees. Sensory:  Normal Motor: Unremarkable  Toe walk (S1): WNL  Heal walk (L5): WNL  Assessment & Plan  Primary Diagnosis & Pertinent Problem List: The primary encounter diagnosis was Chronic pain. Diagnoses of Chronic knee pain (Location of Primary Source of Pain) (Bilateral) (R>L), Encounter for therapeutic drug  level monitoring, Long term current use of opiate analgesic, and Primary osteoarthritis of both knees were also pertinent to this visit.  Visit Diagnosis: 1. Chronic pain   2. Chronic knee pain (Location of Primary Source of Pain) (Bilateral) (R>L)   3. Encounter for therapeutic drug level monitoring   4. Long term current use of opiate analgesic   5. Primary osteoarthritis of both knees     Assessment: No problem-specific assessment & plan notes found for this encounter.   Plan of Care  Pharmacotherapy (Medications Ordered): Meds ordered this encounter  Medications  . gabapentin (NEURONTIN) 300 MG capsule    Sig: Take 2 capsules (600 mg total) by mouth at bedtime.    Dispense:  60 capsule    Refill:  1    Do not place this medication, or any other prescription from our practice, on "Automatic Refill". Patient may have prescription filled one day early if pharmacy is closed on scheduled refill date.  Marland Kitchen oxyCODONE (OXY IR/ROXICODONE) 5 MG immediate release tablet    Sig: Take 1 tablet (5 mg total) by mouth every 6 (six) hours as needed for moderate pain or severe pain.    Dispense:  120 tablet    Refill:  0    Do not place this medication, or any other prescription from our practice, on "Automatic Refill". Patient may have prescription filled one day early if pharmacy is closed on scheduled refill date. Do not fill until: 09/12/15 To last until: 10/12/15  . oxyCODONE (OXY IR/ROXICODONE) 5 MG immediate release tablet    Sig: Take 1 tablet (5 mg total) by mouth every 6 (six) hours as needed for moderate pain or severe pain.    Dispense:  120 tablet  Refill:  0    Do not place this medication, or any other prescription from our practice, on "Automatic Refill". Patient may have prescription filled one day early if pharmacy is closed on scheduled refill date. Do not fill until: 10/12/15 To last until: 11/11/15  . meloxicam (MOBIC) 15 MG tablet    Sig: Take 1 tablet (15 mg total) by  mouth daily.    Dispense:  30 tablet    Refill:  1    Do not place this medication, or any other prescription from our practice, on "Automatic Refill". Patient may have prescription filled one day early if pharmacy is closed on scheduled refill date.    Lab-work & Procedure Ordered: Orders Placed This Encounter  Procedures  . Drugs of abuse screen w/o alc, rtn urine-sln    Volume: 10 ml(s). Minimum 3 ml of urine is needed. Document temperature of fresh sample. Indications: Long term (current) use of opiate analgesic (Z79.891) Test#: [id number] (Tox[employer] Select-13)  . Comprehensive metabolic panel    Order Specific Question:  Has the patient fasted?    Answer:  No  . C-reactive protein  . Magnesium  . Sedimentation rate  . Vitamin B12    Indication: Bone Pain (M89.9)  . Vitamin D pnl(25-hydrxy+1,25-dihy)-bld  . Ambulatory referral to Orthopedic Surgery    Referral Priority:  Routine    Referral Type:  Surgical    Referral Reason:  Specialty Services Required    Referred to Provider:  Georgianne Fick, MD    Requested Specialty:  Orthopedic Surgery    Number of Visits Requested:  1    Imaging Ordered: AMB REFERRAL TO ORTHOPEDIC SURGERY  Interventional Therapies: Scheduled: None at this time PRN Procedures: None at this time.    Referral(s) or Consult(s): Referral to [facility] orthopedics (Dr. Georgianne Fick) for evaluation and treatment of his bilateral knee arthropathy.  Medications administered during this visit: Mr. Qu had no medications administered during this visit.  Future Appointments Date Time Provider [place]  10/13/2015 8:20 AM Milinda Pointer, MD [facility] None    Primary Care Physician: Volanda Napoleon, MD Location: [facility] Note by: Kathlen Brunswick. Dossie Arbour, M.D, DABA, DABAPM, DABPM, DABIPP, FIPP

## 2015-08-17 NOTE — Progress Notes (Signed)
Quick Note:  Lab results reviewed and found to be within normal limits. ______ 

## 2015-08-17 NOTE — Patient Instructions (Signed)

## 2015-08-17 NOTE — Progress Notes (Signed)
Quick Note:   Normal fasting (NPO x 8 hours) glucose levels are between 65-99 mg/dl, with 2 hour fasting, levels are usually less than 140 mg/dl. Any random blood glucose level greater than 200 mg/dl is considered to be Diabetes.  ______ 

## 2015-08-17 NOTE — Progress Notes (Deleted)
   Subjective:    Patient ID: Nathan Hood, male    DOB: May 11, 1966, 50 y.o.   MRN: [id number]  HPI    Review of Systems     Objective:   Physical Exam        Assessment & Plan:

## 2015-08-21 LAB — TOXASSURE SELECT 13 (MW), URINE: PDF: 0

## 2015-10-07 DIAGNOSIS — Z96659 Presence of unspecified artificial knee joint: Secondary | ICD-10-CM | POA: Insufficient documentation

## 2015-10-07 DIAGNOSIS — M12569 Traumatic arthropathy, unspecified knee: Secondary | ICD-10-CM | POA: Insufficient documentation

## 2015-10-13 ENCOUNTER — Encounter: Payer: Medicaid Other | Admitting: Pain Medicine

## 2015-10-13 ENCOUNTER — Telehealth: Payer: Self-pay | Admitting: Pain Medicine

## 2015-10-13 NOTE — Telephone Encounter (Signed)
Noted in patients chart under medications section.

## 2015-10-13 NOTE — Telephone Encounter (Signed)
Had surgery and cannot drive for 2 weeks, surgeon gave him Oxy 5mg  1-3 tabs every 4 hrs for pain, he has enough meds to last until next appt.

## 2015-10-19 ENCOUNTER — Other Ambulatory Visit: Payer: Self-pay | Admitting: Pain Medicine

## 2015-10-28 ENCOUNTER — Encounter: Payer: Self-pay | Admitting: Pain Medicine

## 2015-10-28 ENCOUNTER — Ambulatory Visit: Payer: Medicaid Other | Attending: Pain Medicine | Admitting: Pain Medicine

## 2015-10-28 VITALS — BP 107/85 | HR 85 | Temp 98.3°F | Resp 18 | Ht 72.0 in | Wt 247.0 lb

## 2015-10-28 DIAGNOSIS — Z96652 Presence of left artificial knee joint: Secondary | ICD-10-CM | POA: Insufficient documentation

## 2015-10-28 DIAGNOSIS — F1190 Opioid use, unspecified, uncomplicated: Secondary | ICD-10-CM

## 2015-10-28 DIAGNOSIS — G8929 Other chronic pain: Secondary | ICD-10-CM | POA: Diagnosis not present

## 2015-10-28 DIAGNOSIS — F419 Anxiety disorder, unspecified: Secondary | ICD-10-CM | POA: Diagnosis not present

## 2015-10-28 DIAGNOSIS — M545 Low back pain: Secondary | ICD-10-CM | POA: Insufficient documentation

## 2015-10-28 DIAGNOSIS — M25569 Pain in unspecified knee: Secondary | ICD-10-CM | POA: Diagnosis present

## 2015-10-28 DIAGNOSIS — M25561 Pain in right knee: Secondary | ICD-10-CM | POA: Diagnosis not present

## 2015-10-28 DIAGNOSIS — Z9049 Acquired absence of other specified parts of digestive tract: Secondary | ICD-10-CM | POA: Diagnosis not present

## 2015-10-28 DIAGNOSIS — F329 Major depressive disorder, single episode, unspecified: Secondary | ICD-10-CM | POA: Insufficient documentation

## 2015-10-28 DIAGNOSIS — M170 Bilateral primary osteoarthritis of knee: Secondary | ICD-10-CM | POA: Diagnosis not present

## 2015-10-28 DIAGNOSIS — Z5181 Encounter for therapeutic drug level monitoring: Secondary | ICD-10-CM | POA: Diagnosis not present

## 2015-10-28 DIAGNOSIS — K5909 Other constipation: Secondary | ICD-10-CM | POA: Insufficient documentation

## 2015-10-28 DIAGNOSIS — G4730 Sleep apnea, unspecified: Secondary | ICD-10-CM | POA: Insufficient documentation

## 2015-10-28 DIAGNOSIS — Z79891 Long term (current) use of opiate analgesic: Secondary | ICD-10-CM | POA: Diagnosis not present

## 2015-10-28 DIAGNOSIS — M25562 Pain in left knee: Secondary | ICD-10-CM

## 2015-10-28 DIAGNOSIS — J45909 Unspecified asthma, uncomplicated: Secondary | ICD-10-CM | POA: Diagnosis not present

## 2015-10-28 MED ORDER — OXYCODONE HCL 5 MG PO TABS: 5.0000 mg | ORAL_TABLET | Freq: Four times a day (QID) | ORAL | Status: DC | PRN

## 2015-10-28 NOTE — Progress Notes (Signed)
Patient's Name: Nathan Hood [facility]  Patient type: Established  MRN: [id number]  Service setting: Ambulatory outpatient  DOB: 1966/06/06  Location: [facility]  DOS: 10/28/2015  Primary Care Physician: Volanda Napoleon, MD  Note by: Kathlen Brunswick Dossie Arbour, M.D, DABA, DABAPM, DABPM, DABIPP, FIPP  Referring Physician: Jodi Marble, MD  Specialty: Board-Certified Interventional Pain Management     Primary Reason(s) for Visit: Encounter for prescription drug management (Level of risk: moderate) CC: Knee Pain   HPI  Nathan Hood is a 50 y.o. year old, male patient, who returns today as an established patient. He has Chronic constipation; Chronic low back pain; Depression; Knee Arthropathy (Bilateral); Encounter for therapeutic drug level monitoring; Long term current use of opiate analgesic; Uncomplicated opioid dependence ([place]); Opiate use (30 MME/Day); Chronic pain; Long term prescription opiate use; Chronic knee pain (Location of Primary Source of Pain) (Bilateral) (R>L); Bilateral knee osteoarthritis (Severe) (R>L); Knee Arthralgia  (Bilateral); Arthropathy, traumatic, knee; and H/O total knee replacement on his problem list.. His primarily concern today is the Knee Pain   Pain Assessment: Self-Reported Pain Score: 7  Reported level is compatible with observation Pain Type: Chronic pain Pain Location: Knee Pain Orientation: Right, Left Pain Descriptors / Indicators: Nagging, Throbbing Pain Frequency: Constant  The patient comes into the clinics today for pharmacological management of his chronic pain.The patient indicates recently having undergone a left knee surgery on 10/08/2015. He's pending having the other knee done in the near future. Today we've provided the patient with the handout for the surgeon to manage the postoperative pain.  Date of Last Visit: 08/17/15 Service Provided on Last Visit: Med Refill  Controlled Substance Pharmacotherapy  Assessment & REMS (Risk Evaluation and Mitigation Strategy)  Analgesic: Oxycodone IR 5 mg every 6 hours (20 mg/day) Pill Count: #8 out of 120 Oxycodone 5 mg remaining. Filled 10-12-15. Was given 200 Oxycodone 4 mg post op left knee replacement on 10-08-15. Has taken all these. MME/day: 30 mg/day Pharmacokinetics: Onset of action (Liberation/Absorption): Within expected pharmacological parameters Time to Peak effect (Distribution): Timing and results are as within normal expected parameters Duration of action (Metabolism/Excretion): Within normal limits for medication Pharmacodynamics: Analgesic Effect: More than 50% Activity Facilitation: Medication(s) allow patient to sit, stand, walk, and do the basic ADLs Perceived Effectiveness: Described as relatively effective, allowing for increase in activities of daily living (ADL) Side-effects or Adverse reactions: None reported Monitoring: [place] PMP: Online review of the past 69-month period conducted. Compliant with practice rules and regulations UDS Results/interpretation: Last UDS done on 08/17/2015 came back within normal limits with no unexpected results. Medication Assessment Form: Reviewed. Patient indicates being compliant with therapy Treatment compliance: Compliant Risk Assessment: Aberrant Behavior: None observed today Substance Use Disorder (SUD) Risk Level: No change since last visit Risk of opioid abuse or dependence: 0.7-3.0% with doses ? 36 MME/day and 6.1-26% with doses ? 120 MME/day. Opioid Risk Tool (ORT) Score: Total Score: 0 Low Risk for SUD (Score <3) Depression Scale Score: PHQ-2: PHQ-2 Total Score: 0 No depression (0) PHQ-9: PHQ-9 Total Score: 0 No depression (0-4)  Pharmacologic Plan: No change in therapy, at this time  Laboratory Chemistry  Inflammation Markers Lab Results  Component Value Date   ESRSEDRATE 8 08/17/2015   CRP 0.6 08/17/2015    Renal Function Lab Results  Component Value Date   BUN 19 08/17/2015    CREATININE 1.02 08/17/2015   GFRAA >60 08/17/2015   GFRNONAA >60 08/17/2015    Hepatic Function Lab  Results  Component Value Date   AST 32 08/17/2015   ALT 46 08/17/2015   ALBUMIN 4.2 08/17/2015    Electrolytes Lab Results  Component Value Date   NA 136 08/17/2015   K 4.0 08/17/2015   CL 104 08/17/2015   CALCIUM 8.9 08/17/2015   MG 2.0 08/17/2015    Pain Modulating Vitamins No results found for: [place], [id number], [id number], [id number], [id number]  Coagulation Parameters No results found for: INR, LABPROT  Note: I personally reviewed the above data. Results shared with patient.  Meds  The patient has a current medication list which includes the following prescription(s): acetaminophen, aspirin, docusate sodium, fexofenadine, oxycodone, oxycodone, and oxycodone.  No current outpatient prescriptions on file prior to visit.   No current facility-administered medications on file prior to visit.    ROS  Constitutional: Afebrile, no chills, well hydrated and well nourished Gastrointestinal: No upper or lower GI bleeding, no nausea, no vomiting and no acute GI distress Musculoskeletal: No acute joint swelling or redness, no acute loss of range of motion and no acute onset weakness Neurological: Denies any acute onset apraxia, no episodes of paralysis, no acute loss of coordination, no acute loss of consciousness and no acute onset aphasia, dysarthria, agnosia, or amnesia  Allergies  Nathan Hood has No Known Allergies.  [place]  Medical:  Nathan Hood  has a past medical history of Head injury; Asthma; Sleep apnea; Hiatal hernia; Gallbladder disease; Arthritis; and Anxiety. Family: family history includes Arthritis in his mother; Birth defects in his maternal grandfather; COPD in his mother; Stroke in his maternal grandfather. Surgical:  has past surgical history that includes Hernia repair; Knee surgery (Right); and Cholecystectomy. Tobacco:  reports that he has never  smoked. He does not have any smokeless tobacco history on file. Alcohol:  reports that he drinks about 3.6 oz of alcohol per week. Drug:  reports that he does not use illicit drugs.  Physical Examination  Constitutional Vitals: Blood pressure 107/85, pulse 85, temperature 98.3 F (36.8 C), temperature source Oral, resp. rate 18, height 6' (1.829 m), weight 247 lb (112.038 kg), SpO2 98 %. Calculated BMI: Body mass index is 33.49 kg/(m^2). (30-34.9 kg/m2) Obese (Class I) - 68% higher incidence of chronic pain General appearance: Alert, cooperative, oriented x 3, in no acute distress, well nourished, well developed, well hydrated Eyes: PERLA Respiratory: No evidence respiratory distress, no audible rales or ronchi and no use of accessory muscles of respiration Psych: Alert, oriented to person, oriented to place and oriented to time  Cervical Spine Exam  Inspection: Normal anatomy, no anomalies observed Cervical Lordosis: Normal Alignment: Symetrical Functional ROM: Within functional limits (WFL) AROM: WFL Sensory: No sensory anomalies reported or detected  Upper Extremity Exam    Right  Left  Inspection: No gross anomalies detected  Inspection: No gross anomalies detected  Functional ROM: Adequate  Functional ROM: Adequate  AROM: Adequate  AROM: Adequate  Sensory: No sensory anomalies reported or detected  Sensory: No sensory anomalies reported or detected  Motor: Unremarkable  Motor: Unremarkable  Vascular: Normal skin color, temperature, and hair growth. No peripheral edema or cyanosis  Vascular: Normal skin color, temperature, and hair growth. No peripheral edema or cyanosis   Thoracic Spine  Inspection: No gross anomalies detected Alignment: Symetrical Functional ROM: Within functional limits [facility]) AROM: Adequate Palpation: WNL  Lumbar Spine  Inspection: No gross anomalies detected Alignment: Symetrical Functional ROM: Within functional limits [facility]) AROM: Adequate Sensory:  No sensory anomalies reported or detected Palpation: WNL  Provocative Tests: Lumbar Hyperextension and rotation test: deferred Patrick's Maneuver: deferred  Gait Assessment  Gait: WNL  Lower Extremities    Right  Left  Inspection: No gross anomalies detected  Inspection: Evidence of recent surgery with a healing scar.  Functional ROM: Within functional limits [facility])  Functional ROM: Within functional limits [facility])  AROM: Decreased for the knee   AROM: Decreased for the knee.   Sensory: No sensory anomalies reported or detected  Sensory: No sensory anomalies reported or detected  Motor: Unremarkable  Motor: Unremarkable  Toe walk (S1): WNL  Toe walk (S1): WNL  Heal walk (L5): WNL  Heal walk (L5): WNL   Assessment & Plan  Primary Diagnosis & Pertinent Problem List: The primary encounter diagnosis was Chronic pain. Diagnoses of Encounter for therapeutic drug level monitoring, Long term current use of opiate analgesic, Chronic knee pain (Location of Primary Source of Pain) (Bilateral) (R>L), and Opiate use (30 MME/Day) were also pertinent to this visit.  Visit Diagnosis: 1. Chronic pain   2. Encounter for therapeutic drug level monitoring   3. Long term current use of opiate analgesic   4. Chronic knee pain (Location of Primary Source of Pain) (Bilateral) (R>L)   5. Opiate use (30 MME/Day)     Problems updated and reviewed during this visit: No problems updated.  Problem-specific Plan(s): No problem-specific assessment & plan notes found for this encounter.  No new assessment & plan notes have been filed under this hospital service since the last note was generated. Service: Pain Management   Plan of Care   Problem List Items Addressed This Visit      High   Chronic knee pain (Location of Primary Source of Pain) (Bilateral) (R>L) (Chronic)   Relevant Medications   aspirin 81 MG tablet   acetaminophen (TYLENOL) 500 MG tablet   oxyCODONE (OXY IR/ROXICODONE) 5 MG immediate  release tablet   oxyCODONE (OXY IR/ROXICODONE) 5 MG immediate release tablet   oxyCODONE (OXY IR/ROXICODONE) 5 MG immediate release tablet   Chronic pain - Primary (Chronic)   Relevant Medications   aspirin 81 MG tablet   acetaminophen (TYLENOL) 500 MG tablet   oxyCODONE (OXY IR/ROXICODONE) 5 MG immediate release tablet   oxyCODONE (OXY IR/ROXICODONE) 5 MG immediate release tablet   oxyCODONE (OXY IR/ROXICODONE) 5 MG immediate release tablet     Medium   Encounter for therapeutic drug level monitoring   Long term current use of opiate analgesic (Chronic)   Relevant Orders   ToxASSURE Select 13 (MW), Urine   Opiate use (30 MME/Day) (Chronic)       Pharmacotherapy (Medications Ordered): Meds ordered this encounter  Medications  . oxyCODONE (OXY IR/ROXICODONE) 5 MG immediate release tablet    Sig: Take 1 tablet (5 mg total) by mouth every 6 (six) hours as needed for moderate pain or severe pain.    Dispense:  120 tablet    Refill:  0    Do not place this medication, or any other prescription from our practice, on "Automatic Refill". Patient may have prescription filled one day early if pharmacy is closed on scheduled refill date. Do not fill until: 11/11/15 To last until: 12/11/15  . oxyCODONE (OXY IR/ROXICODONE) 5 MG immediate release tablet    Sig: Take 1 tablet (5 mg total) by mouth every 6 (six) hours as needed for moderate pain or severe pain.    Dispense:  120 tablet    Refill:  0    Do not place this medication,  or any other prescription from our practice, on "Automatic Refill". Patient may have prescription filled one day early if pharmacy is closed on scheduled refill date. Do not fill until: 12/11/15 To last until: 01/10/16  . oxyCODONE (OXY IR/ROXICODONE) 5 MG immediate release tablet    Sig: Take 1 tablet (5 mg total) by mouth every 6 (six) hours as needed for moderate pain or severe pain.    Dispense:  120 tablet    Refill:  0    Do not place this medication, or any  other prescription from our practice, on "Automatic Refill". Patient may have prescription filled one day early if pharmacy is closed on scheduled refill date. Do not fill until: 01/10/16 To last until: 02/09/16    [facility] & Procedure Ordered: Orders Placed This Encounter  Procedures  . ToxASSURE Select 13 (MW), Urine    Imaging Ordered: None  Interventional Therapies: Scheduled:  None at this time.    Considering:  None at this time.    PRN Procedures:  None at this time.    Referral(s) or Consult(s): None at this time.  New Prescriptions   No medications on file    Medications administered during this visit: Nathan Hood had no medications administered during this visit.  Future Appointments Date Time Provider [place]  11/04/2015 11:00 AM Aldona Lento, PT [facility] None  01/20/2016 2:00 PM Milinda Pointer, MD [facility] None    Primary Care Physician: Volanda Napoleon, MD Location: [facility] Note by: Kathlen Brunswick. Dossie Arbour, M.D, DABA, DABAPM, DABPM, DABIPP, FIPP  Pain Score Disclaimer: We use the NRS-11 scale. This is a self-reported, subjective measurement of pain severity with only modest accuracy. It is used primarily to identify changes within a particular patient. It must be understood that outpatient pain scales are significantly less accurate that those used for research, where they can be applied under ideal controlled circumstances with minimal exposure to variables. In reality, the score is likely to be a combination of pain intensity and pain affect, where pain affect describes the degree of emotional arousal or changes in action readiness caused by the sensory experience of pain. Factors such as social and work situation, setting, emotional state, anxiety levels, expectation, and prior pain experience may influence pain perception and show large inter-individual differences that may also be affected by time  variables.

## 2015-10-28 NOTE — Progress Notes (Signed)
Safety precautions to be maintained throughout the outpatient stay will include: orient to surroundings, keep bed in low position, maintain call bell within reach at all times, provide assistance with transfer out of bed and ambulation.   #8 out of 120 Oxycodone 5 mg remaining. Filled 10-12-15  Was given 200 Oxycodone 4 mg post op left knee replacement on 10-08-15. Has taken all these.

## 2015-11-03 LAB — TOXASSURE SELECT 13 (MW), URINE: PDF: 0

## 2015-11-04 ENCOUNTER — Encounter: Payer: Self-pay | Admitting: Physical Therapy

## 2015-11-04 ENCOUNTER — Ambulatory Visit: Payer: Medicaid Other | Attending: Orthopedic Surgery | Admitting: Physical Therapy

## 2015-11-04 DIAGNOSIS — M25662 Stiffness of left knee, not elsewhere classified: Secondary | ICD-10-CM | POA: Insufficient documentation

## 2015-11-04 DIAGNOSIS — M25562 Pain in left knee: Secondary | ICD-10-CM | POA: Insufficient documentation

## 2015-11-04 DIAGNOSIS — M6281 Muscle weakness (generalized): Secondary | ICD-10-CM | POA: Diagnosis not present

## 2015-11-04 NOTE — Therapy (Signed)
[facility] [address] [place], [address] Phone: [phone]   Fax:  [phone]  Physical Therapy Evaluation  Patient Details  Name: Nathan Hood MRN: [id number] Date of Birth: 1966-05-23 Referring Provider: Domingo Pulse MD  Encounter Date: 11/04/2015      PT End of Session - 11/04/15 1604    Visit Number 1   Number of Visits 12   Date for PT Re-Evaluation 01/27/16   Authorization Type 1   Authorization Time Period 10 (M-caid)   PT Start Time 1105   PT Stop Time 1200   PT Time Calculation (min) 55 min   Activity Tolerance Patient tolerated treatment well   Behavior During Therapy [facility] for tasks assessed/performed      Past Medical History  Diagnosis Date  . Head injury   . Asthma   . Sleep apnea   . Hiatal hernia   . Gallbladder disease   . Arthritis   . Anxiety     Past Surgical History  Procedure Laterality Date  . Hernia repair      x2  . Knee surgery Right   . Cholecystectomy      There were no vitals filed for this visit.       Subjective Assessment - 11/04/15 1123    Subjective Patient reports increased bilateral knee pain (L knee worse than R). Torn meniscus bilaterally about 6 months ago, received MRI 2 months ago and TKA surgery was performed on the 10/08/15. Patient was discharged from the hospital on 10/10/15 and had home health physical therapy for 2 weeks post surgery. Patient reports increased difficuly walking and transitioning to/from walking surfaces(such as pavement to gravel), prolonged standing (over 79min), squatting, dressing, prolonged sitting (over 48min). sleep (states he wakes up several times at night and has difficulty falling asleep) States alleviating factors include laying down, taking pain medications, elevation and icing the knee (reports he does it 3-4 x a day). Pt states pain is worse in the afternoon with activites and requires assistance to perform  functional activities at home. The worst pain the patient experiences is a 6/10 andthe best pain he experiences is a 4/10. He is anticipating right TKA in the near future as well.    Pertinent History Onset of knee pain in 2008; Torn meniscus 6 months prior (conformed by MRI) L TKA surgery on 10/08/15   Limitations Sitting;Lifting;Standing;House hold activities   Diagnostic tests MRI: torn bilaterally meniscus   Patient Stated Goals To improve quality of life.    Currently in Pain? Yes   Pain Score 6    Pain Location Knee   Pain Orientation Left;Right   Pain Descriptors / Indicators Nagging;Throbbing   Pain Type Chronic pain            [facility] PT Assessment - 11/04/15 1119    Assessment   Medical Diagnosis Left Knee DJD 715.16   Referring Provider Domingo Pulse MD   Onset Date/Surgical Date 10/08/15   Hand Dominance Right   Next MD Visit 12/05/15   Prior Therapy home health before outpatient   Precautions   Precautions None   Restrictions   Weight Bearing Restrictions No   Balance Screen   Has the patient fallen in the past 6 months No   Has the patient had a decrease in activity level because of a fear of falling?  No   Is the patient reluctant to leave their home because  of a fear of falling?  No   Home Environment   Living Environment Private residence   Living Arrangements Spouse/significant other   Available Help at Discharge Friend(s);Family   Type of [place] to enter   Entrance Stairs-Number of Steps 3   Entrance Stairs-Rails Can reach both   [place] One level   [place] - 2 wheels;Cane - single point;Bedside commode   Prior Function   Level of Independence Independent   Vocation On disability   Vocation Requirements N/A   Leisure fishing, exercise(running), and golf      Objective: Observation: Ambulation: Increased stance time on R LE; L LE circumduction during the swing phase. Edema: Increased diffuse edema  throughout L knee Palpation: Increased tenderness to palpation over distal aspect of L quadriceps; Increased tissue tightness to bilateral distal hamstring tendons on the L.  Measurement:  Knee L AROM/MMT: Flexion:110, Extension: neutral; R Knee AROM/MMT: WNL Balance: WNL for functional activties  Outcome Measures:  LEFS: 9/64 (Signifcant decrease in lower extremity function)  Therapeutic Exercise: Patient performed exercises with guidance, verbal and tactile cues and demonstration of therapist: Forward walking -- 51ft x 2 (required UE support to perform) Side stepping -- 69ft x 2 (required UE support to perform) [employer] -- 63ft x 2 Calf raises -- x10 (required UE support to perform) Ball roll outs with 5# ankle weight under the ball -- x15  Seated clamshells with green band -- x15 Seated hip adduction/ glute squeezes against ball in sitting -- x15 Weight shifting at balance master -- forward/backward(left/right) in standing; limits of stability x 2 (11 level of difficulty)  Education: Educated to elevate LE with ice placed over the knee 3x day for 20-71min.  Manual Therapy:  Performed soft tissue mobilization utilizing superficial techniques to decrease pain and spasms in the quadriceps musculature with the patient positioned in long sitting.   Patient response to treatment: No increase in symptoms/pain noted throughout exercise treatment. Good demonstration of hip abduction with the green band requiring minimal cueing to activate hip musculature.           PT Education - 11/04/15 1559    Education provided Yes   Education Details HEP: forward walking, side stepping, wedding marches, ball roll outs with 5# ankle weight, seated clamshells with green band, seated hip adduction against ball in sitting, weight shifting forward/backward(left/right), educated to elevate LE with ice placed over the knee   Person(s) Educated Patient   Methods Explanation;Demonstration    Comprehension Verbalized understanding;Returned demonstration             PT Long Term Goals - 11/04/15 1611    PT LONG TERM GOAL #1   Title Pt will be independent with home exercise program aimed at improving hip/knee strength, endurance, and control to better perform prolonged standing activities by 01/27/16 and continue improvement of symptoms after discharge of therapy   Baseline Dependent with exercise performance and progression.   Status New   PT LONG TERM GOAL #2   Title Pt will score >40/64 on the LEFS by 01/27/16 to demonstrate signifcant improvement in self perceived lower extremity function and better be able to perform ambulation without onset of symptoms.   Baseline LEFS: 9/64   Status New   PT LONG TERM GOAL #3   Title Pt will be able to stand for over 1 hour without onset of pain by 01/27/16 to demonstrate sigificant improvement in lower extremity function and endurance.  Baseline Requires a sitting rest break after 10 mins of standing   Status New               Plan - 11/04/15 1606    Clinical Impression Statement  Pt is a 50 yo right hand dominant male  experiencing bilateral knee pain and s/p L TKA surgery 10/08/15. He has decreased ability to perform functional activities such as bending, squatting, and dressing. Pt further demonstrates significant decrease in the lower extremity function with LEFS score of 9/64 (severe lower extremity dysfunction). Pt. Also has arthritis and pain in right knee and is unable to bear weight onto the R LE because of knee pain resulting in significant decrease in ability to perform activities of daily living. He also had decreased endurance, control, and strength on bilaterally. Patient was discharged from hospital on 10/10/15  and had home health physical therapy for 2 weeks. Patient reports increased difficuly walking and transitioning to/from walking surfaces(such as pavement to gravel), prolonged standing (over 63min), squatting,  dressing,  prolonged sitting (over 21min). sleep (states he wakes up several times at night and has difficulty falling asleep) States alleviating factors include laying down, taking pain medications, elevation and icing the knee (reports he does it 3-4 x a day). Pt states pain is worse in the afternoon  with activites and requires assistance to perform functional activities at home. The worst pain the patient experiences is a 6/10 andthe best pain he experiences is a 4/10.  He has decreased ROM and strength left LE and decreased strength right LE. He has limited knowledge of appropriate pain control, progression of exercises in order to be able to walk and perform household and community activities with less difficulty.    Rehab Potential Good   Clinical Impairments Affecting Rehab Potential (-) medical coverage; right LE pain, arthritis (+) age, family support   PT Frequency 1x / week   PT Duration 12 weeks   PT Treatment/Interventions ADLs/Self Care Home Management;Moist Heat;Therapeutic exercise;Manual techniques;Therapeutic activities;Electrical Stimulation;Cryotherapy;Stair training;Gait training;Patient/family education;Passive range of motion;Neuromuscular re-education   PT Next Visit Plan Bridges, standing balance activies, step ups on low steps   PT Home Exercise Plan forward walking, side stepping, wedding marches, ball roll outs with 5# ankle weight, seated clamshells with green band, seated hip adduction against ball in sitting, weight shifting forward/backward(left/right), educated to elevate LE with ice placed over the knee   Consulted and Agree with Plan of Care Patient      Patient will benefit from skilled therapeutic intervention in order to improve the following deficits and impairments:  Abnormal gait, Decreased coordination, Decreased range of motion, Difficulty walking, Increased fascial restricitons, Decreased endurance, Increased muscle spasms, Decreased activity tolerance,  Decreased cognition, Postural dysfunction, Increased edema, Impaired sensation, Decreased strength, Hypomobility, Impaired flexibility, Decreased balance, Pain, Decreased safety awareness  Visit Diagnosis: Muscle weakness (generalized)  Stiffness of left knee, not elsewhere classified  Pain in left knee     Problem List Patient Active Problem List   Diagnosis Date Noted  . Arthropathy, traumatic, knee 10/07/2015  . H/O total knee replacement 10/07/2015  . Knee Arthralgia  (Bilateral) 06/17/2015  . Long term prescription opiate use 05/20/2015  . Chronic knee pain (Location of Primary Source of Pain) (Bilateral) (R>L) 05/20/2015  . Bilateral knee osteoarthritis (Severe) (R>L) 05/20/2015  . Chronic constipation 04/21/2015  . Chronic low back pain 04/21/2015  . Depression 04/21/2015  . Knee Arthropathy (Bilateral) 04/21/2015  . Encounter for therapeutic drug level monitoring 04/21/2015  .  Long term current use of opiate analgesic 04/21/2015  . Uncomplicated opioid dependence ([place]) 04/21/2015  . Opiate use (30 MME/Day) 04/21/2015  . Chronic pain 04/21/2015    Blythe Stanford, SPT 11/04/2015, 7:45 PM  [facility] [address] [place], [address] Phone: [phone]   Fax:  [phone]  Name: AKIVA MASCOLO MRN: [id number] Date of Birth: 19-Jan-1966

## 2015-11-07 ENCOUNTER — Other Ambulatory Visit: Payer: Self-pay | Admitting: Pain Medicine

## 2015-11-08 ENCOUNTER — Encounter: Payer: Self-pay | Admitting: Pain Medicine

## 2015-11-08 DIAGNOSIS — Z9114 Patient's other noncompliance with medication regimen: Secondary | ICD-10-CM | POA: Insufficient documentation

## 2015-11-08 DIAGNOSIS — F1490 Cocaine use, unspecified, uncomplicated: Secondary | ICD-10-CM | POA: Insufficient documentation

## 2015-11-08 NOTE — Progress Notes (Signed)
Quick Note:  NOTE: This forensic urine drug screen (UDS) test was conducted using a state-of-the-art ultra high performance liquid chromatography and mass spectrometry system (UPLC/MS-MS), the most sophisticated and accurate method available. UPLC/MS-MS is 1,000 times more precise and accurate than standard gas chromatography and mass spectrometry (GC/MS). This system can analyze 26 drug categories and 180 drug compounds.  The results of this test came back with unexpected findings. Unreported Benzoylecgonine, a metabolite of cocaine; its presence indicates use of this drug. Source is most commonly illicit.  The findings of this UDT are unacceptable and incompatible with appropriate, safe, and responsible use of controlled substances. This represents non-compliance with the safe use of controlled substances. We will no longer offer this therapy as a treatment option for this patient. ______

## 2015-11-17 ENCOUNTER — Encounter: Payer: Medicaid Other | Admitting: Physical Therapy

## 2015-11-25 ENCOUNTER — Ambulatory Visit: Payer: Medicaid Other | Admitting: Physical Therapy

## 2015-11-27 ENCOUNTER — Telehealth: Payer: Self-pay | Admitting: *Deleted

## 2015-11-27 ENCOUNTER — Ambulatory Visit: Payer: Medicaid Other | Admitting: Physical Therapy

## 2015-11-27 ENCOUNTER — Encounter: Payer: Medicaid Other | Admitting: Physical Therapy

## 2015-11-27 NOTE — Telephone Encounter (Signed)
lvm made the pt aware that his appt time for 01/20/16@ 2PM has changed. Gave new appt time for 01/20/2016@ 11:20am...td

## 2015-12-02 ENCOUNTER — Ambulatory Visit: Payer: Medicaid Other | Admitting: Physical Therapy

## 2015-12-02 ENCOUNTER — Encounter: Payer: Self-pay | Admitting: Physical Therapy

## 2015-12-02 DIAGNOSIS — M6281 Muscle weakness (generalized): Secondary | ICD-10-CM | POA: Diagnosis not present

## 2015-12-02 DIAGNOSIS — M25662 Stiffness of left knee, not elsewhere classified: Secondary | ICD-10-CM

## 2015-12-02 DIAGNOSIS — M25562 Pain in left knee: Secondary | ICD-10-CM

## 2015-12-02 NOTE — Therapy (Signed)
[facility] [address], [place], [address] Phone: [phone]   Fax:  [phone]  Physical Therapy Treatment  Patient Details  Name: Nathan Hood MRN: [id number] Date of Birth: August 15, 1965 Referring Provider: Domingo Pulse MD  Encounter Date: 12/02/2015      PT End of Session - 12/02/15 1157    Visit Number 2   Number of Visits 12   Date for PT Re-Evaluation 01/27/16   Authorization Type 2   Authorization Time Period 10 (M-caid)   PT Start Time 1050   PT Stop Time 1130   PT Time Calculation (min) 40 min   Activity Tolerance Patient tolerated treatment well   Behavior During Therapy [facility] for tasks assessed/performed      Past Medical History  Diagnosis Date  . Head injury   . Asthma   . Sleep apnea   . Hiatal hernia   . Gallbladder disease   . Arthritis   . Anxiety     Past Surgical History  Procedure Laterality Date  . Hernia repair      x2  . Knee surgery Right   . Cholecystectomy      There were no vitals filed for this visit.      Subjective Assessment - 12/02/15 1053    Subjective Patient returns and states the knee has been feeling stiff with certain activities such as walking on slanted hills and stairs. States he's been able to push mow is yard, perform outdoor chores, and ride his stationary bicycle without increase in pain. He is able to walk up steps leading with left LE now as well. He reports therapy is beneficial for strengthening and guidance to perform correct exercises to get best results.    Pertinent History Onset of knee pain in 2008; Torn meniscus 6 months prior (conformed by MRI) L TKA surgery on 10/08/15   Limitations Sitting;Lifting;Standing;House hold activities   Diagnostic tests MRI: torn bilaterally meniscus   Patient Stated Goals To improve quality of life.    Currently in Pain? No/denies        Objective: Observation: Ambulation: Increased stance  time on R LE, improved from previous session Edema: Mild edema throughout L knee and L ankle Palpation: Increased tenderness to palpation over distal aspect of L quadriceps; Increased tissue tightness to bilateral distal hamstring tendons on the L.  Measurement:  Knee L and R AROM/MMT: Flexion: WNL; Extension: WNL Balance: Single leg stance: 20 sec B  Therapeutic Exercise: Patient performed exercises with guidance, verbal and tactile cues and demonstration of therapist: Single leg balance -- 2 x 30sec bilaterally -- required minimal UE support to perform Step ups on 4" step -- x10 bilaterally Tandem walking on airex beam -- x5 (down/back) Mini lunges with UE support -- x 10 ( Left foot in front) Terminal knee extension in standing-- x25 bilaterally with green band Stepper machine -- 30sec (stopped early due to increased R pain)  Education: Reviewed SLR in long sitting to perform at home.   Patient response to treatment: No increase in pain in the left knee noted throughout or after therapy; minor increase in pain noted in R knee when performing step up and stepping machine exercises. Good demonstration of tandem walking on airex beam requiring minimal UE support to perform without deviations. Required verbal cues and demonstration for correct alignment of left hip/knee with standing exercises       PT Education - 12/02/15 1158  Education provided Yes   Education Details HEP: long sitting SLR, sitting off edge of chair SLR, terminal knee extension   Person(s) Educated Patient   Methods Demonstration;Explanation;Handout   Comprehension Verbalized understanding;Returned demonstration             PT Long Term Goals - 11/04/15 1611    PT LONG TERM GOAL #1   Title Pt will be independent with home exercise program aimed at improving hip/knee strength, endurance, and control to better perform prolonged standing activities by 01/27/16 and continue improvement of symptoms after  discharge of therapy   Baseline Dependent with exercise performance and progression.   Status New   PT LONG TERM GOAL #2   Title Pt will score >40/64 on the LEFS by 01/27/16 to demonstrate signifcant improvement in self perceived lower extremity function and better be able to perform ambulation without onset of symptoms.   Baseline LEFS: 9/64   Status New   PT LONG TERM GOAL #3   Title Pt will be able to stand for over 1 hour without onset of pain by 01/27/16 to demonstrate sigificant improvement in lower extremity function and endurance.    Baseline Requires a sitting rest break after 10 mins of standing   Status New               Plan - 12/02/15 1149    Clinical Impression Statement Pt demonstrates significant improvement in L knee function demonstrating improved ability to perform functional activties and tolerate increase in exercise progression without increase in symptoms .Although patient is getting better, he continues to demonstrate decreased muscular strength, coordination, and endurance and will benefit from further skilled therapy to return to prior level of function.    Rehab Potential Good   Clinical Impairments Affecting Rehab Potential (-) medical coverage; (+) age, family support   PT Frequency 1x / week   PT Duration 12 weeks   PT Treatment/Interventions ADLs/Self Care Home Management;Moist Heat;Therapeutic exercise;Manual techniques;Therapeutic activities;Electrical Stimulation;Cryotherapy;Stair training;Gait training;Patient/family education;Passive range of motion;Neuromuscular re-education   PT Next Visit Plan Bridges, standing balance activies, step ups on low steps   PT Home Exercise Plan forward walking, side stepping, wedding marches, ball roll outs with 5# ankle weight, seated clamshells with green band, seated hip adduction against ball in sitting, weight shifting forward/backward(left/right), educated to elevate LE with ice placed over the knee   Consulted and  Agree with Plan of Care Patient      Patient will benefit from skilled therapeutic intervention in order to improve the following deficits and impairments:  Abnormal gait, Decreased coordination, Decreased range of motion, Difficulty walking, Increased fascial restricitons, Decreased endurance, Increased muscle spasms, Decreased activity tolerance, Decreased cognition, Postural dysfunction, Increased edema, Impaired sensation, Decreased strength, Hypomobility, Impaired flexibility, Decreased balance, Pain, Decreased safety awareness  Visit Diagnosis: Muscle weakness (generalized)  Stiffness of left knee, not elsewhere classified  Pain in left knee     Problem List Patient Active Problem List   Diagnosis Date Noted  . Cocaine use (see 10/28/2015 UDS) 11/08/2015  . Controlled substance agreement terminated 11/08/2015  . Arthropathy, traumatic, knee 10/07/2015  . H/O total knee replacement 10/07/2015  . Knee Arthralgia  (Bilateral) 06/17/2015  . Long term prescription opiate use 05/20/2015  . Chronic knee pain (Location of Primary Source of Pain) (Bilateral) (R>L) 05/20/2015  . Bilateral knee osteoarthritis (Severe) (R>L) 05/20/2015  . Chronic constipation 04/21/2015  . Chronic low back pain 04/21/2015  . Depression 04/21/2015  . Knee Arthropathy (Bilateral) 04/21/2015  .  Encounter for therapeutic drug level monitoring 04/21/2015  . Long term current use of opiate analgesic 04/21/2015  . Uncomplicated opioid dependence ([place]) 04/21/2015  . Opiate use (30 MME/Day) 04/21/2015  . Chronic pain 04/21/2015    Blythe Stanford, SPT 12/02/2015, 12:01 PM  [facility] [address], [place], [address] Phone: [phone]   Fax:  [phone]  Name: Nathan Hood MRN: [id number] Date of Birth: [birth year]

## 2015-12-05 ENCOUNTER — Other Ambulatory Visit: Payer: Self-pay | Admitting: Pain Medicine

## 2015-12-09 ENCOUNTER — Ambulatory Visit: Payer: Medicaid Other | Attending: Orthopedic Surgery | Admitting: Physical Therapy

## 2015-12-09 DIAGNOSIS — M25562 Pain in left knee: Secondary | ICD-10-CM

## 2015-12-09 DIAGNOSIS — M25662 Stiffness of left knee, not elsewhere classified: Secondary | ICD-10-CM | POA: Insufficient documentation

## 2015-12-09 DIAGNOSIS — M6281 Muscle weakness (generalized): Secondary | ICD-10-CM | POA: Diagnosis not present

## 2015-12-09 NOTE — Therapy (Signed)
[facility] [address] [place], [address] Phone: [phone]   Fax:  [phone]  Physical Therapy Treatment  Patient Details  Name: Nathan Hood MRN: [id number] Date of Birth: 1966-06-25 Referring Provider: Domingo Pulse MD  Encounter Date: 12/09/2015      PT End of Session - 12/09/15 1525    Visit Number 3   Number of Visits 12   Date for PT Re-Evaluation 01/27/16   Authorization Type 3   Authorization Time Period 10 (M-caid)   PT Start Time 1034   PT Stop Time 1116   PT Time Calculation (min) 42 min   Activity Tolerance Patient tolerated treatment well   Behavior During Therapy [facility] for tasks assessed/performed      Past Medical History  Diagnosis Date  . Head injury   . Asthma   . Sleep apnea   . Hiatal hernia   . Gallbladder disease   . Arthritis   . Anxiety     Past Surgical History  Procedure Laterality Date  . Hernia repair      x2  . Knee surgery Right   . Cholecystectomy      There were no vitals filed for this visit.      Subjective Assessment - 12/09/15 1041    Subjective Patienet returns and states he was able to play a round of golf without increased pain in the left knee but reports increased symptoms in the right knee. Reports no pain currently.   Pertinent History Onset of knee pain in 2008; Torn meniscus 6 months prior (conformed by MRI) L TKA surgery on 10/08/15   Limitations Sitting;Lifting;Standing;House hold activities   Diagnostic tests MRI: torn bilaterally meniscus   Patient Stated Goals To improve quality of life.    Currently in Pain? No/denies      Objective: Observation: Ambulation: Increased stance time on R LE, improved from previous session Edema: Mild edema throughout L knee and L ankle Palpation: Increased tenderness to palpation over distal aspect of L quadriceps; minor tissue tightness to bilateral distal hamstring tendons on the  L.  Measurement:  Knee L and R AROM/MMT: Flexion: WNL; Extension: WNL Balance: Single leg stance: 20 sec B  Therapeutic Exercise: Patient performed exercises with guidance, verbal and tactile cues and demonstration of therapist: Hamstring bridges on bosu -- x20 Single leg balance -- 2 x 30sec bilaterally on airex  -- required minimal UE support to perform SLR with 5# weights over upper thigh -- x 20 Leg press -- bilaterally 65 x 20 #65; unliaterally on L #35 x20, on R #25 x20 Diagonal wedding march on 4" step -- x20 (on left only)  Resisted walk outs with grey band -- x 5 each direction (forward/backward/laterally)  SLS ball toss at pitchback -- x15 bilaterally --Require minimal toe touch weight bearing to perform Tandem walking on airex beam -- x6 (down/back) Terminal knee extension in standing-- x25 bilaterally with green band   Patient response to treatment: No increase in left knee pain during or after therapy. Good demonstration of SLS stance ball toss requiring intermittent toe touch support to maintain balance. Required verbal cues and demonstration for correct alignment of left hip/knee with standing exercises.          PT Education - 12/09/15 1526    Education provided Yes   Education Details HEP: Diona Foley toss balance activities, diagonal step ups    Person(s) Educated Patient   Methods Explanation;Demonstration  Comprehension Returned demonstration;Verbalized understanding             PT Long Term Goals - 11/04/15 1611    PT LONG TERM GOAL #1   Title Pt will be independent with home exercise program aimed at improving hip/knee strength, endurance, and control to better perform prolonged standing activities by 01/27/16 and continue improvement of symptoms after discharge of therapy   Baseline Dependent with exercise performance and progression.   Status New   PT LONG TERM GOAL #2   Title Pt will score >40/64 on the LEFS by 01/27/16 to demonstrate signifcant  improvement in self perceived lower extremity function and better be able to perform ambulation without onset of symptoms.   Baseline LEFS: 9/64   Status New   PT LONG TERM GOAL #3   Title Pt will be able to stand for over 1 hour without onset of pain by 01/27/16 to demonstrate sigificant improvement in lower extremity function and endurance.    Baseline Requires a sitting rest break after 10 mins of standing   Status New               Plan - 12/09/15 1523    Clinical Impression Statement Patinet tolerated increase in exercise advancement without increase in symptoms indicating improvement in motor control and muscular endurance. Although patient is improving he continues to demonstrate decreased muscular strength, coordination and propioception throughout bilateral LE and will benefit from further skilled therapy to return to prior level of function.    Rehab Potential Good   Clinical Impairments Affecting Rehab Potential (-) medical coverage; (+) age, family support   PT Frequency 1x / week   PT Duration 12 weeks   PT Treatment/Interventions ADLs/Self Care Home Management;Moist Heat;Therapeutic exercise;Manual techniques;Therapeutic activities;Electrical Stimulation;Cryotherapy;Stair training;Gait training;Patient/family education;Passive range of motion;Neuromuscular re-education   PT Next Visit Plan standing balance activies, step ups on low steps, further advancement of standing activities   PT Home Exercise Plan forward walking, side stepping, wedding marches, ball roll outs with 5# ankle weight, seated clamshells with green band, seated hip adduction against ball in sitting, weight shifting forward/backward(left/right), educated to elevate LE with ice placed over the knee   Consulted and Agree with Plan of Care Patient      Patient will benefit from skilled therapeutic intervention in order to improve the following deficits and impairments:  Abnormal gait, Decreased coordination,  Decreased range of motion, Difficulty walking, Increased fascial restricitons, Decreased endurance, Increased muscle spasms, Decreased activity tolerance, Decreased cognition, Postural dysfunction, Increased edema, Impaired sensation, Decreased strength, Hypomobility, Impaired flexibility, Decreased balance, Pain, Decreased safety awareness  Visit Diagnosis: Muscle weakness (generalized)  Stiffness of left knee, not elsewhere classified  Pain in left knee     Problem List Patient Active Problem List   Diagnosis Date Noted  . Cocaine use (see 10/28/2015 UDS) 11/08/2015  . Controlled substance agreement terminated 11/08/2015  . Arthropathy, traumatic, knee 10/07/2015  . H/O total knee replacement 10/07/2015  . Knee Arthralgia  (Bilateral) 06/17/2015  . Long term prescription opiate use 05/20/2015  . Chronic knee pain (Location of Primary Source of Pain) (Bilateral) (R>L) 05/20/2015  . Bilateral knee osteoarthritis (Severe) (R>L) 05/20/2015  . Chronic constipation 04/21/2015  . Chronic low back pain 04/21/2015  . Depression 04/21/2015  . Knee Arthropathy (Bilateral) 04/21/2015  . Encounter for therapeutic drug level monitoring 04/21/2015  . Long term current use of opiate analgesic 04/21/2015  . Uncomplicated opioid dependence ([place]) 04/21/2015  . Opiate use (30 MME/Day) 04/21/2015  .  Chronic pain 04/21/2015    Blythe Stanford, SPT 12/09/2015, 3:27 PM  [place] [facility] [address], [place], [address] Phone: [phone]   Fax:  [phone]  Name: ANTHONEY DICKE MRN: [id number] Date of Birth: Oct 20, 1965

## 2015-12-16 ENCOUNTER — Ambulatory Visit: Payer: Medicaid Other | Admitting: Physical Therapy

## 2015-12-23 ENCOUNTER — Ambulatory Visit: Payer: Medicaid Other | Admitting: Physical Therapy

## 2015-12-23 ENCOUNTER — Encounter: Payer: Self-pay | Admitting: Physical Therapy

## 2015-12-23 DIAGNOSIS — M6281 Muscle weakness (generalized): Secondary | ICD-10-CM | POA: Diagnosis not present

## 2015-12-23 DIAGNOSIS — M25662 Stiffness of left knee, not elsewhere classified: Secondary | ICD-10-CM

## 2015-12-23 DIAGNOSIS — M25562 Pain in left knee: Secondary | ICD-10-CM

## 2015-12-23 NOTE — Therapy (Signed)
[place] [facility] [address] [place], [address] Phone: [phone]   Fax:  [phone]  Physical Therapy Treatment/ Discharge Summary  Patient Details  Name: Nathan Hood MRN: [id number] Date of Birth: 1965/08/28 Referring Provider: Domingo Pulse MD  Encounter Date: 12/23/2015    Patient began physical therapy 11/04/15 and attended 4 sessions through 6/21/1 7with goals achieved and patient independent with home program for self management of symptoms and exercises. Plan discharge from physical therapy.       PT End of Session - 12/23/15 1528    Visit Number 4   Number of Visits 9   Date for PT Re-Evaluation 01/27/16   Authorization Type 3   Authorization Time Period 8 (M-caid)   PT Start Time 1045   PT Stop Time 1117   PT Time Calculation (min) 32 min   Activity Tolerance Patient tolerated treatment well   Behavior During Therapy WFL for tasks assessed/performed      Past Medical History  Diagnosis Date  . Head injury   . Asthma   . Sleep apnea   . Hiatal hernia   . Gallbladder disease   . Arthritis   . Anxiety     Past Surgical History  Procedure Laterality Date  . Hernia repair      x2  . Knee surgery Right   . Cholecystectomy      There were no vitals filed for this visit.      Subjective Assessment - 12/23/15 1051    Subjective Patient returns and states he has no difficulty with any functional activties other than desecending the stairs which he requires UE support to perform. Reports he has been performing HEP is prepared to be discharged from physical therapy.    Pertinent History Onset of knee pain in 2008; Torn meniscus 6 months prior (conformed by MRI) L TKA surgery on 10/08/15   Limitations Sitting;Lifting;Standing;House hold activities   Diagnostic tests MRI: torn bilaterally meniscus   Patient Stated Goals To improve quality of life.    Currently in Pain? No/denies        Objective: Observation: Ambulation: Increased stance time on R LE, improved from previous session Edema: Mild edema throughout L knee and L ankle Palpation: Increased tenderness to palpation over distal aspect of L quadriceps; minor tissue tightness to bilateral distal hamstring tendons on the L.  Outcome Measure: LEFS: 46/64  Measurement:  Knee L and R AROM/MMT: Flexion: WNL; Extension: WNL Balance: Single leg stance: 30 sec B  Therapeutic Exercise: Patient performed exercises with guidance, verbal and tactile cues and demonstration of therapist: Standing Tandem rotations with 5# weight -- x 20 bilaterally Lateral step ups onto 8 in step -- x 20 bilaterally Rotational step ups onto 8 in step -- x20 bilaterally  Single leg stance with contralateral arm raise -- 30sec x 3 Heel raises off of 4" step -- x20  Heel curls in sitting with blue band -- x20 Single leg deadlift on raised chair with 10# -- x10  Patient response to treatment: No increase in left knee pain during or after therapy. Good demonstration of single leg deadlift requiring minimal cueing on proper LE and hip position to perform to proper technique. Verbalized good understanding of home program         PT Education - 12/23/15 1528    Education provided Yes   Education Details HEP: single leg modified dead lift, lateral and rotational step ups;  reviewed verbally home exercise program   Person(s) Educated Patient   Methods Explanation;Demonstration   Comprehension Verbalized understanding;Returned demonstration             PT Long Term Goals - 12/23/15 1054    PT LONG TERM GOAL #1   Title Pt will be independent with home exercise program aimed at improving hip/knee strength, endurance, and control to better perform prolonged standing activities by 01/27/16 and continue improvement of symptoms after discharge of therapy   Baseline Dependent with exercise performance and progression. (12/23/15: Independent  with exercise performance and progression)   Status Achieved   PT LONG TERM GOAL #2   Title Pt will score >40/64 on the LEFS by 01/27/16 to demonstrate signifcant improvement in self perceived lower extremity function and better be able to perform ambulation without onset of symptoms.   Baseline LEFS: 9/64 (12/23/15: LEFS: 45/64 )   Status Achieved   PT LONG TERM GOAL #3   Title Pt will be able to stand for over 1 hour without onset of pain by 01/27/16 to demonstrate sigificant improvement in lower extremity function and endurance.    Baseline Requires a sitting rest break after 10 mins of standing (12/23/15: Able to stand for >1 hour without onset of symptoms. )   Status Achieved               Plan - 12/23/15 1529    Clinical Impression Statement Patient has met all long term goals demonstrating significant improvement in LE function as indicated by increased LEFS score and improved functional muscular endurance/strength. Patient is independent with HEP and is to be discharged from physical therapy.    Rehab Potential Good   Clinical Impairments Affecting Rehab Potential (-) medical coverage; (+) age, family support   PT Frequency 1x / week   PT Duration 12 weeks   PT Treatment/Interventions ADLs/Self Care Home Management;Moist Heat;Therapeutic exercise;Manual techniques;Therapeutic activities;Electrical Stimulation;Cryotherapy;Stair training;Gait training;Patient/family education;Passive range of motion;Neuromuscular re-education   PT Next Visit Plan Discharge from PT   PT Home Exercise Plan forward walking, side stepping, wedding marches, ball roll outs with 5# ankle weight, seated clamshells with green band, seated hip adduction against ball in sitting, weight shifting forward/backward(left/right); hamstring strengthening with resistive band   Consulted and Agree with Plan of Care Patient      Patient will benefit from skilled therapeutic intervention in order to improve the  following deficits and impairments:  Abnormal gait, Decreased coordination, Decreased range of motion, Difficulty walking, Increased fascial restricitons, Decreased endurance, Increased muscle spasms, Decreased activity tolerance, Decreased cognition, Postural dysfunction, Increased edema, Impaired sensation, Decreased strength, Hypomobility, Impaired flexibility, Decreased balance, Pain, Decreased safety awareness  Visit Diagnosis: Muscle weakness (generalized)  Stiffness of left knee, not elsewhere classified  Pain in left knee     Problem List Patient Active Problem List   Diagnosis Date Noted  . Cocaine use (see 10/28/2015 UDS) 11/08/2015  . Controlled substance agreement terminated 11/08/2015  . Arthropathy, traumatic, knee 10/07/2015  . H/O total knee replacement 10/07/2015  . Knee Arthralgia  (Bilateral) 06/17/2015  . Long term prescription opiate use 05/20/2015  . Chronic knee pain (Location of Primary Source of Pain) (Bilateral) (R>L) 05/20/2015  . Bilateral knee osteoarthritis (Severe) (R>L) 05/20/2015  . Chronic constipation 04/21/2015  . Chronic low back pain 04/21/2015  . Depression 04/21/2015  . Knee Arthropathy (Bilateral) 04/21/2015  . Encounter for therapeutic drug level monitoring 04/21/2015  . Long term current use of opiate analgesic 04/21/2015  .  Uncomplicated opioid dependence ([place]) 04/21/2015  . Opiate use (30 MME/Day) 04/21/2015  . Chronic pain 04/21/2015    Blythe Stanford, SPT 12/23/2015, 5:59 PM  [place] [facility] [address], [place], [address] Phone: [phone]   Fax:  [phone]  Name: Nathan Hood MRN: [id number] Date of Birth: [birth year]

## 2015-12-24 NOTE — Telephone Encounter (Signed)

## 2015-12-30 ENCOUNTER — Ambulatory Visit: Payer: Medicaid Other | Admitting: Physical Therapy

## 2016-01-06 ENCOUNTER — Ambulatory Visit: Payer: Medicaid Other | Admitting: Physical Therapy

## 2016-01-13 ENCOUNTER — Encounter: Payer: Medicaid Other | Admitting: Physical Therapy

## 2016-01-20 ENCOUNTER — Encounter: Payer: Medicaid Other | Admitting: Pain Medicine

## 2016-03-06 IMAGING — MR MR KNEE*L* W/O CM
4 of 6 series · 19 of 40 positions shown · non-contrast
Comparison: Plain films of the left knee 05/21/2015.

CLINICAL DATA: Chronic bilateral knee pain and swelling. No known
injury. Subsequent encounter.

EXAM:
MRI OF THE LEFT KNEE WITHOUT CONTRAST
TECHNIQUE: Multiplanar, multisequence MR imaging of the knee was performed. No
intravenous contrast was administered.

[Series 2: PD fat-sat · axial · 4.0mm · 0.29mm/px · z∈[-71,+39]mm · 8 of 23 slices shown (1 of 4)]
[im 1/23]
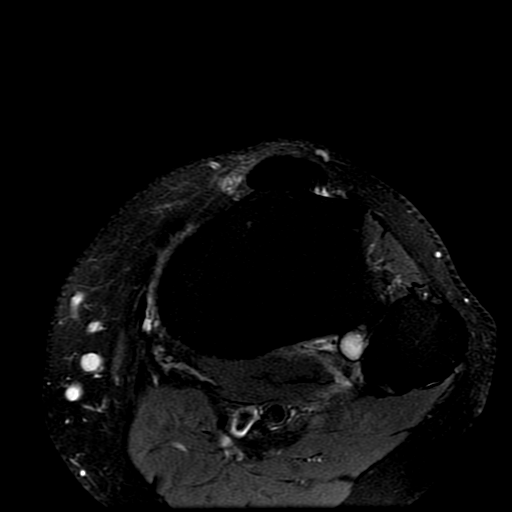
[im 4/23]
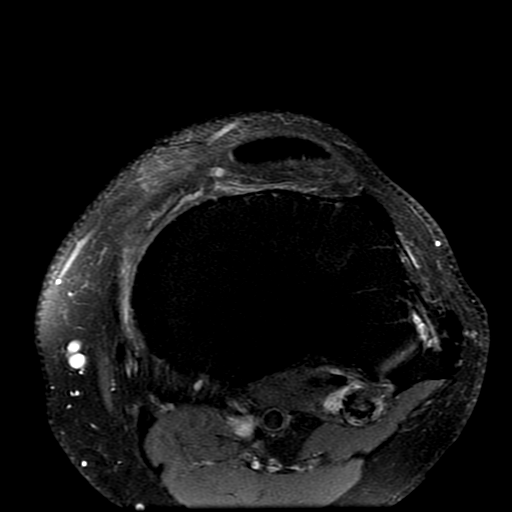
[im 7/23]
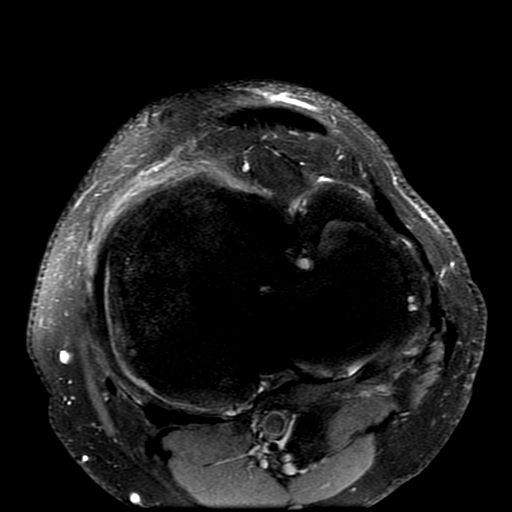
[im 10/23]
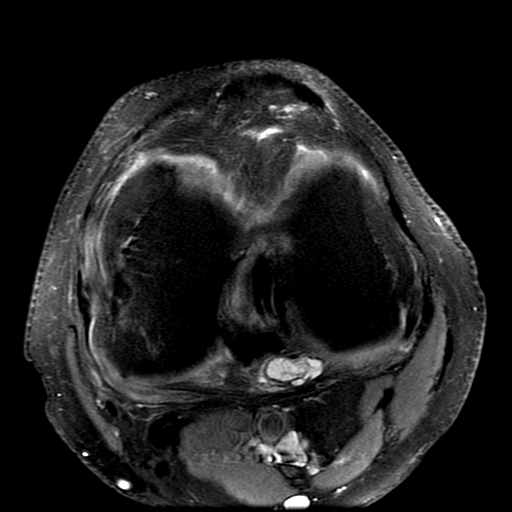
[im 13/23]
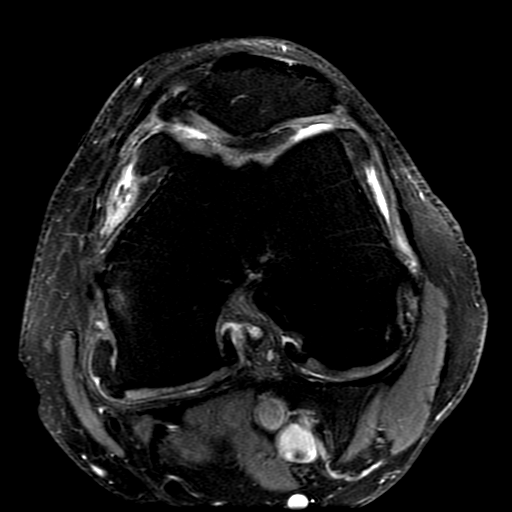
[im 16/23]
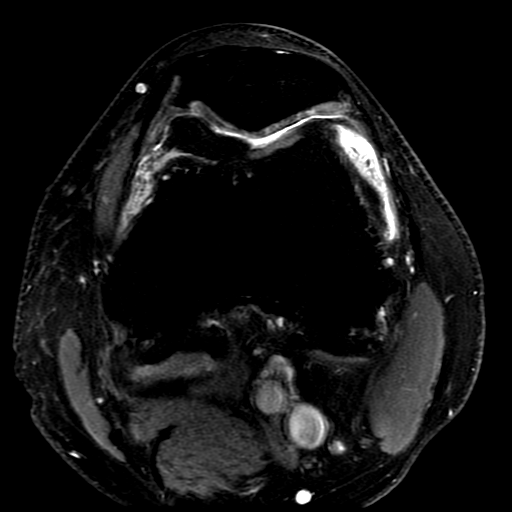
[im 19/23]
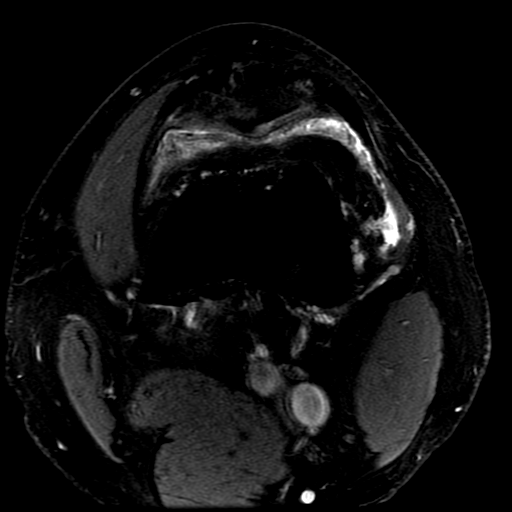
[im 23/23]
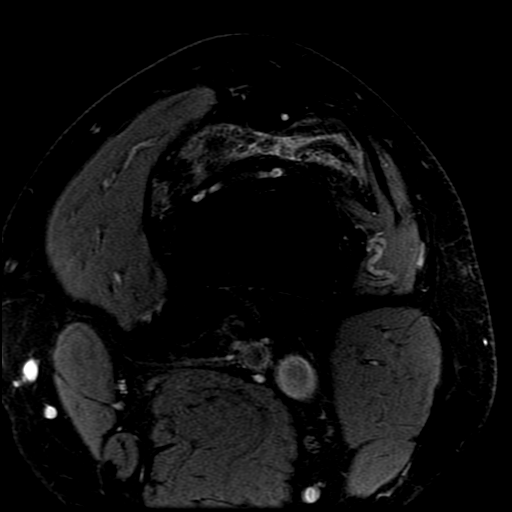

[Series 3: PD fat-sat · coronal · 4.0mm · 0.31mm/px · 5 of 23 slices shown (2 of 4)]
[im 1/23]
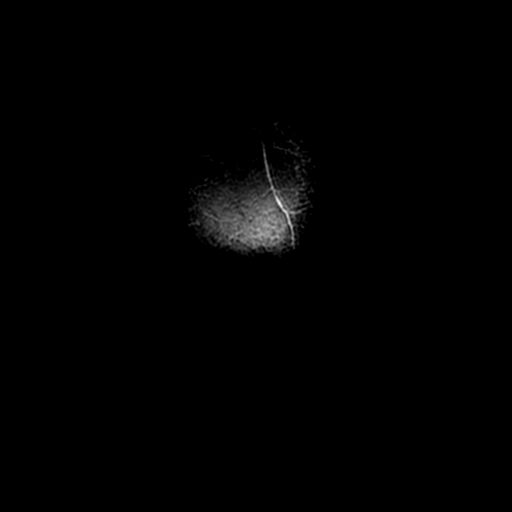
[im 4/23]
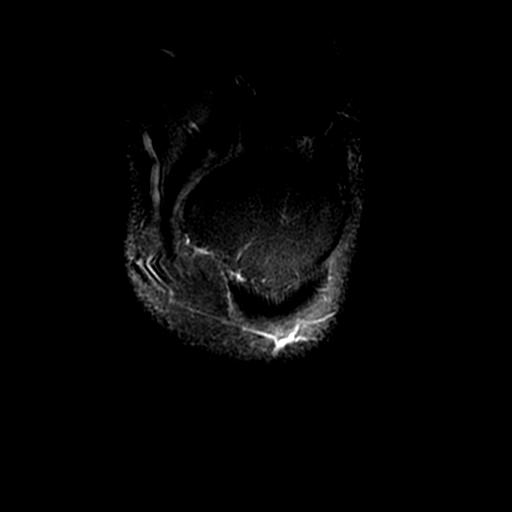
[im 8/23]
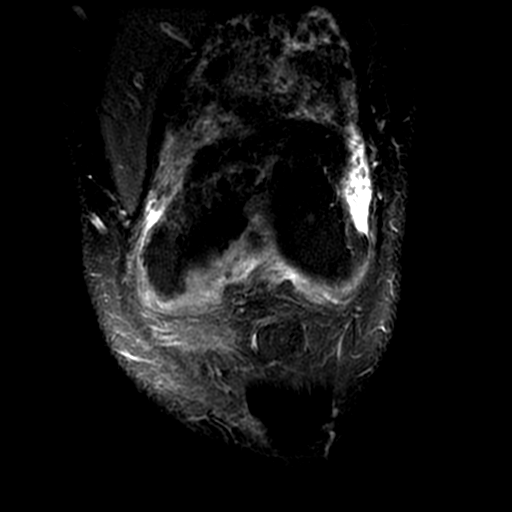
[im 12/23]
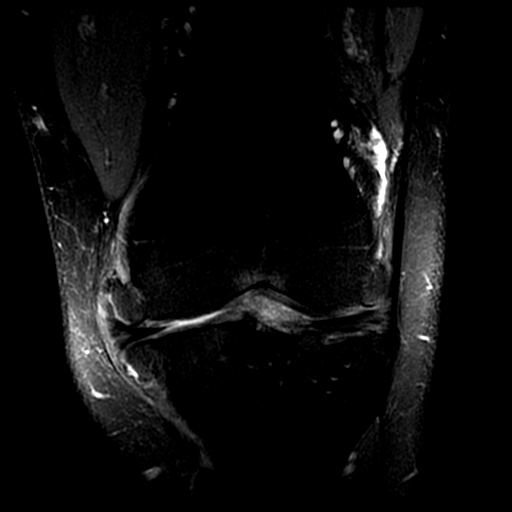
[im 19/23]
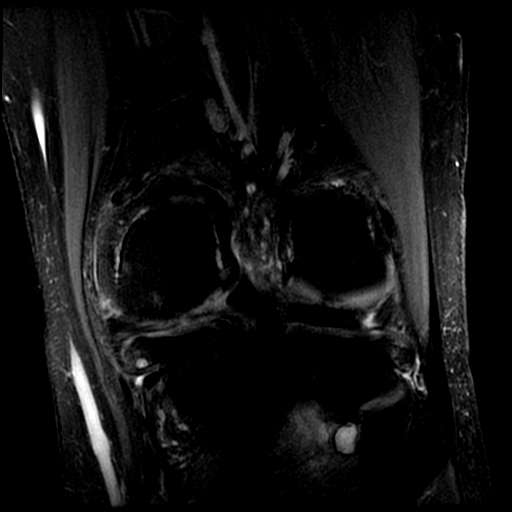

[Series 4: PD fat-sat · sagittal · 4.0mm · 0.31mm/px · 3 of 22 slices shown (3 of 4)]
[im 4/22]
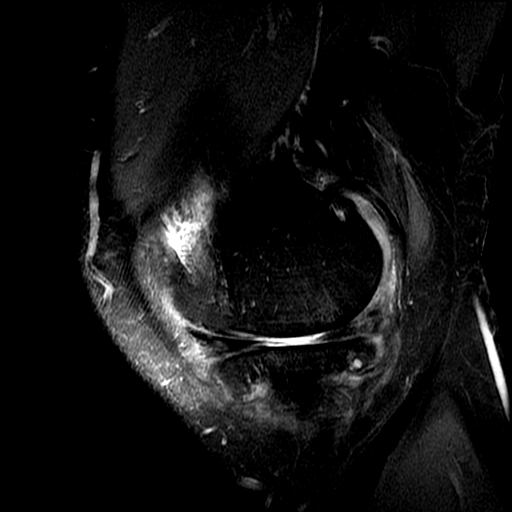
[im 11/22]
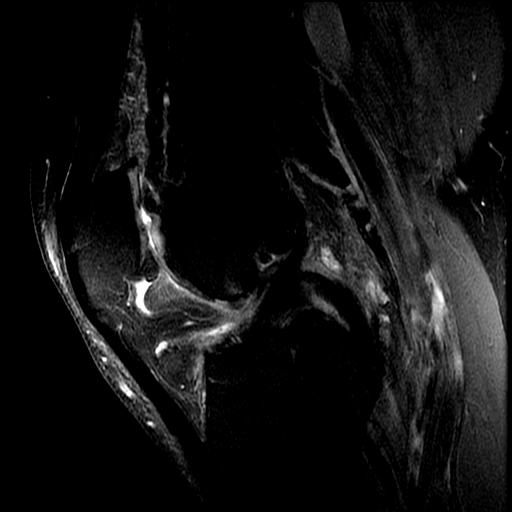
[im 18/22]
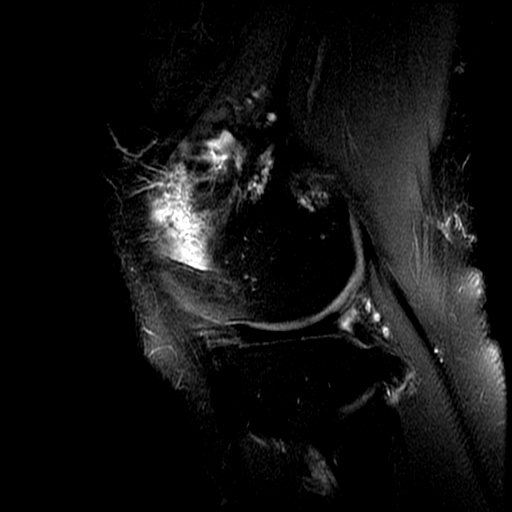

[Series 7: PD fat-sat · coronal · 2.0mm · 0.31mm/px · 3 of 12 slices shown (4 of 4)]
[im 1/12]
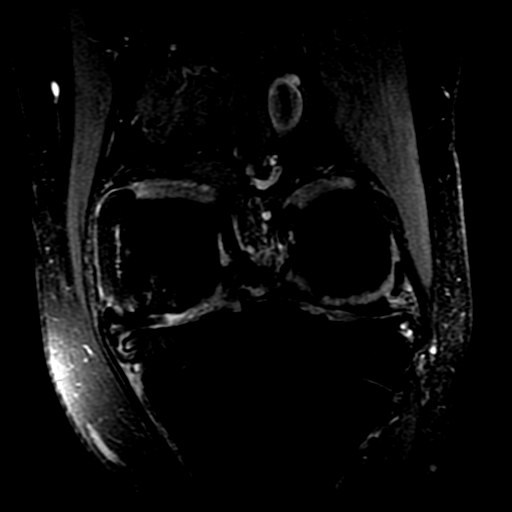
[im 8/12]
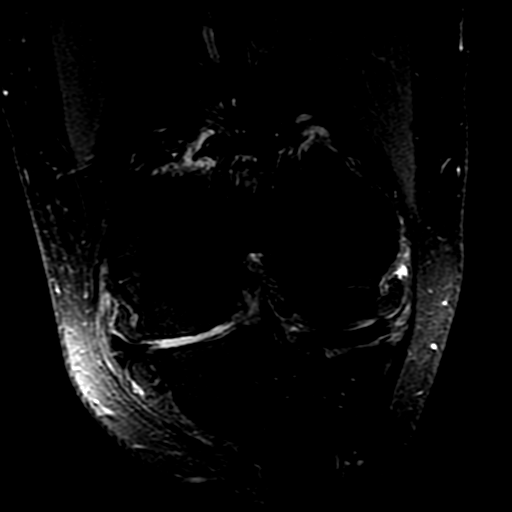
[im 12/12]
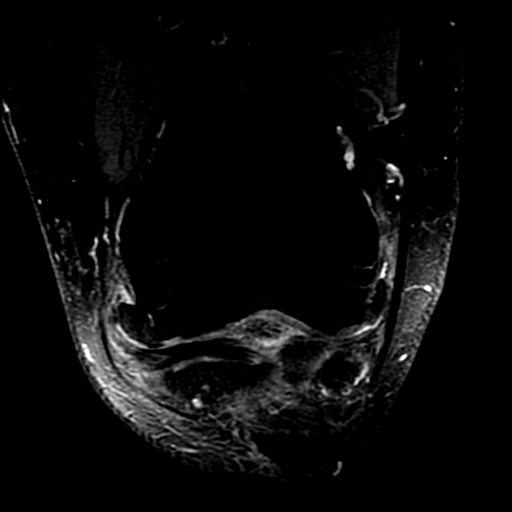

[19 of 40 positions shown; findings below may reference images not displayed]

FINDINGS: MENISCI

Medial meniscus: There is complex tearing throughout the posterior
horn of the medial meniscus. The body of the medial meniscus is
extruded peripherally with fraying along its free edge.

Lateral meniscus:  Intact.

LIGAMENTS

Cruciates:  Intact.

Collaterals:  Intact.

CARTILAGE

Patellofemoral: Hyaline cartilage is thinned and irregular
throughout.

Medial: Hyaline cartilage appears almost completely denuded with
associated joint space narrowing.

Lateral:  Thinned and irregular throughout.

Joint:  There is a small joint effusion

Popliteal Fossa: No Baker's cyst. Multi-septated cyst off the
anterior, inferior border of the proximal tib-fib joint measures
cm transverse by 1.6 cm craniocaudal by 0.9 cm AP.

Extensor Mechanism:  Intact.

Bones: Extremely bulky tricompartmental osteophytosis is present
including a large osteophyte projecting off the lateral margin of
the tibia along the popliteus tendon and proximal tib-fib joint.
There is subchondral sclerosis and mild edema about the medial
compartment.
IMPRESSION: Dominant finding is markedly advanced for age tricompartmental
osteoarthritis appearing worst medially where it is severe.
Associated complex tearing throughout the posterior horn of medial
meniscus is identified.

Degenerative change proximal tib-fib joint with a small synovial or
ganglion cyst emanating off the anterior, inferior margin of the
joint.

## 2016-03-06 IMAGING — MR MR KNEE*R* W/O CM
4 of 6 series · 18 of 40 positions shown · non-contrast
Comparison: Radiographs 05/21/2015

CLINICAL DATA: Bilateral knee pain and swelling. Symptoms since
6441 but worsening recently. History of prior knee surgery.

EXAM:
MRI OF THE RIGHT KNEE WITHOUT CONTRAST
TECHNIQUE: Multiplanar, multisequence MR imaging of the knee was performed. No
intravenous contrast was administered.

[Series 2: PD fat-sat · axial · 4.0mm · 0.29mm/px · z∈[-53,+56]mm · 6 of 23 slices shown (1 of 4)]
[im 1/23]
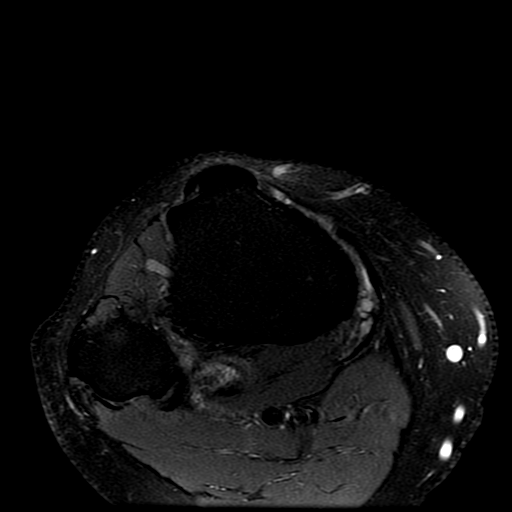
[im 5/23]
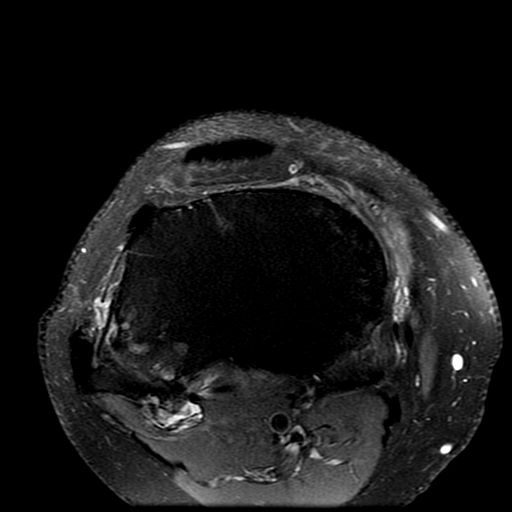
[im 9/23]
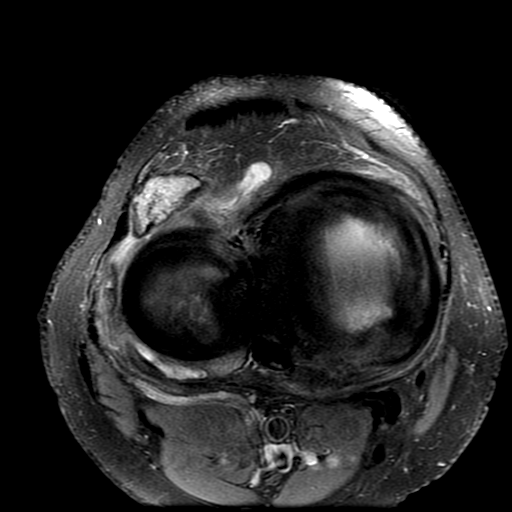
[im 14/23]
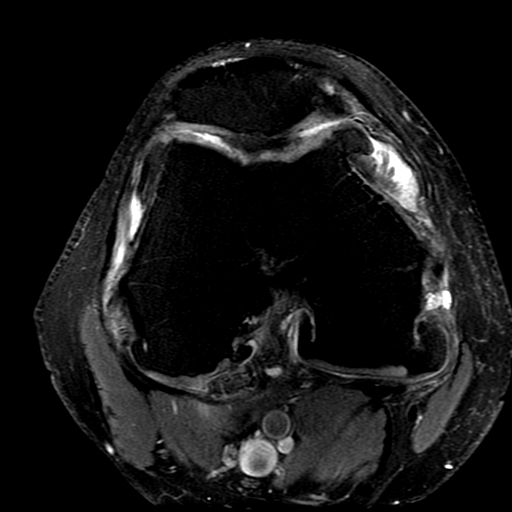
[im 18/23]
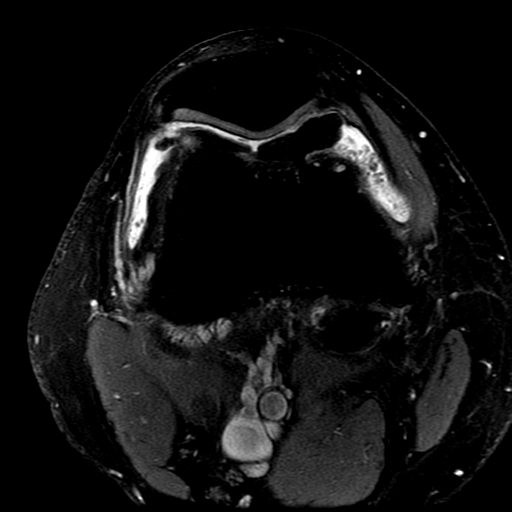
[im 23/23]
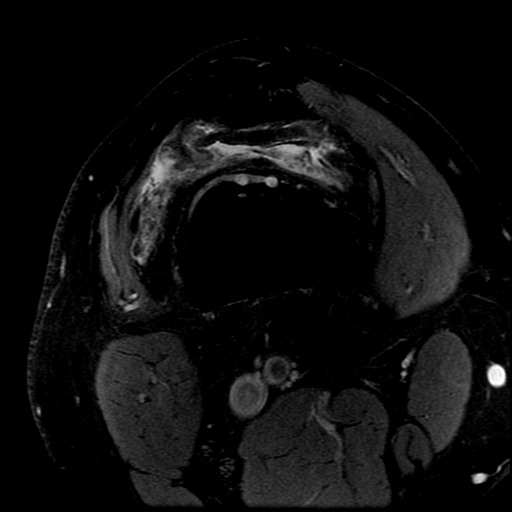

[Series 3: PD fat-sat · coronal · 4.0mm · 0.31mm/px · 6 of 25 slices shown (2 of 4)]
[im 1/25]
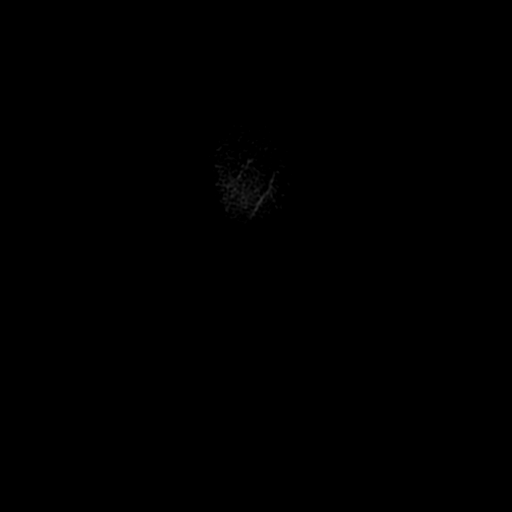
[im 5/25]
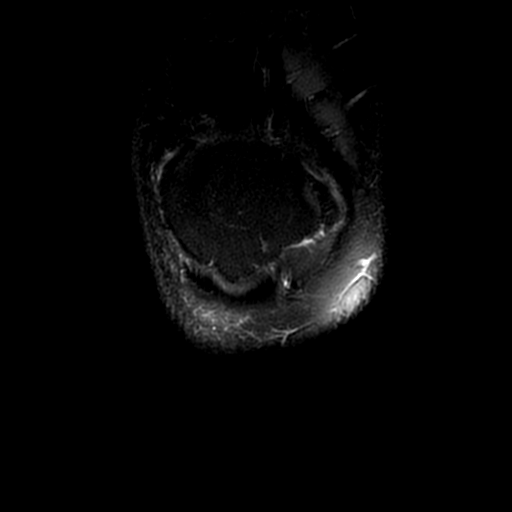
[im 9/25]
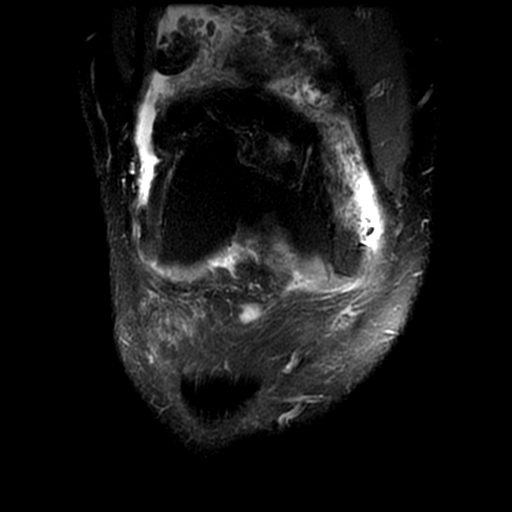
[im 13/25]
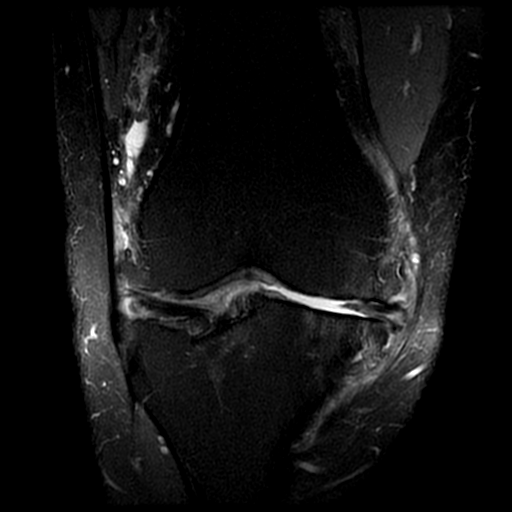
[im 17/25]
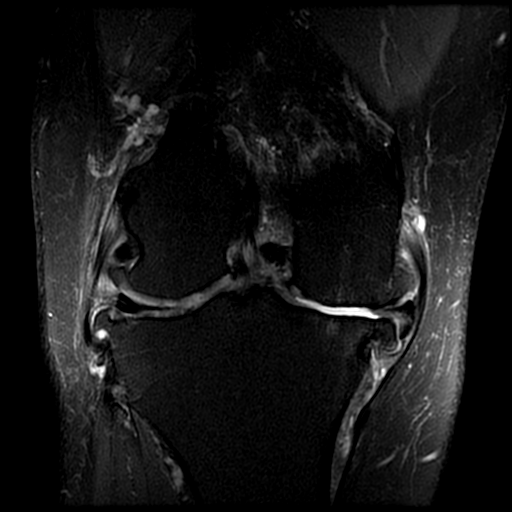
[im 21/25]
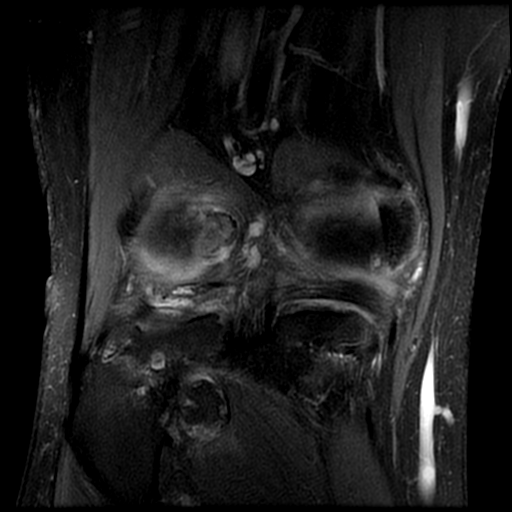

[Series 6: PD fat-sat · sagittal · 4.0mm · 0.31mm/px · 3 of 22 slices shown (3 of 4)]
[im 4/22]
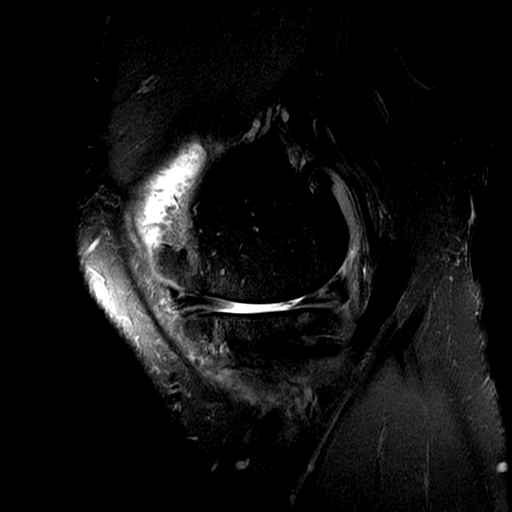
[im 11/22]
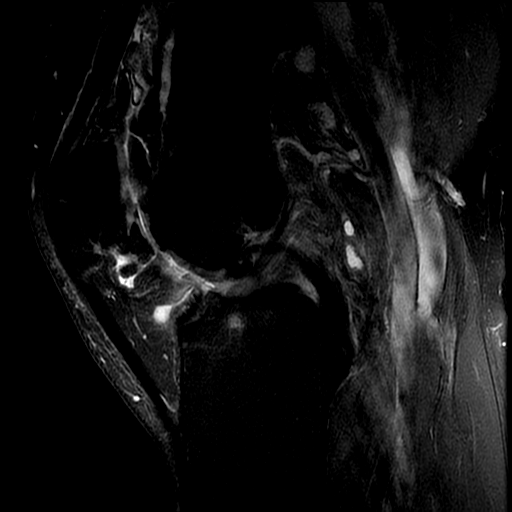
[im 18/22]
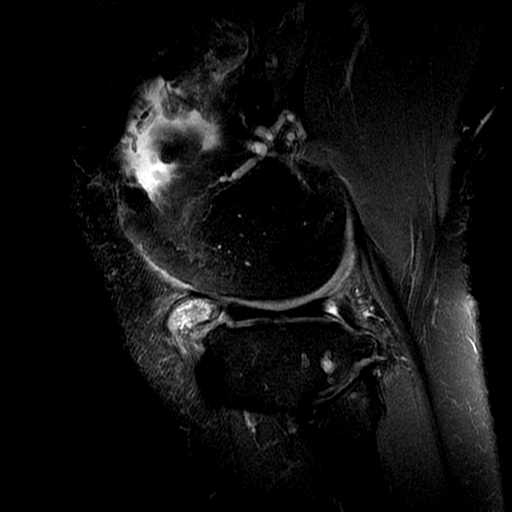

[Series 7: PD fat-sat · coronal · 2.0mm · 0.31mm/px · 3 of 12 slices shown (4 of 4)]
[im 1/12]
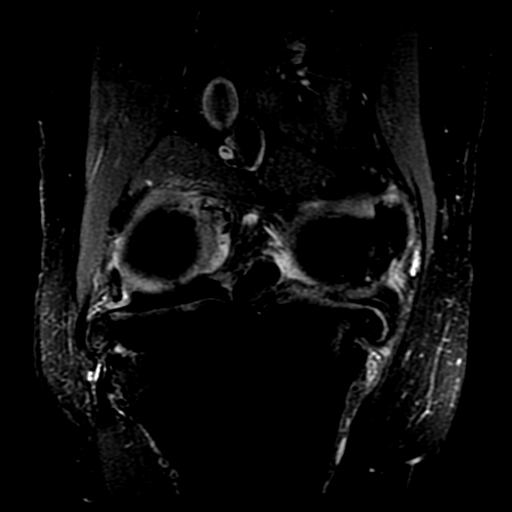
[im 8/12]
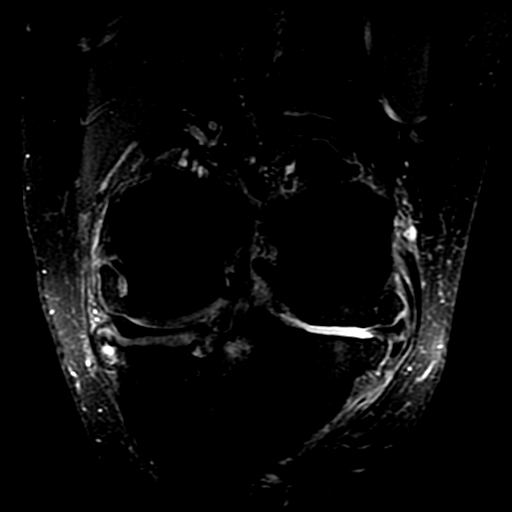
[im 12/12]
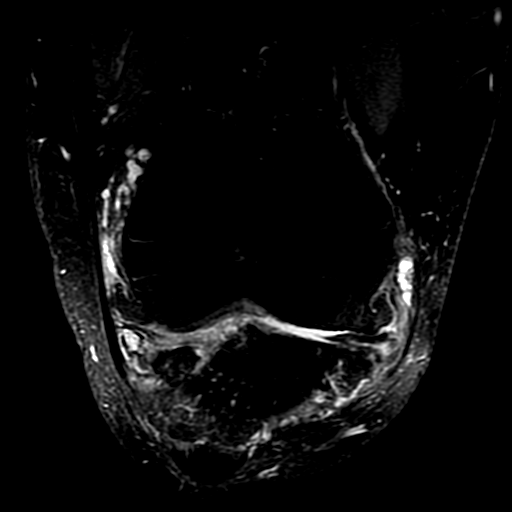

[18 of 40 positions shown; findings below may reference images not displayed]

FINDINGS: MENISCI

Medial meniscus: There is a fairly extensive flap type tear
involving the mid body region with a large flipped meniscal fragment
in the medial gutter.

Lateral meniscus:  Intact.

LIGAMENTS

Cruciates:  Intact.

Collaterals:  Intact.

CARTILAGE

Patellofemoral: Moderate degenerative chondrosis with cartilage
thinning, fissuring and fraying. No full-thickness cartilage defect.
There is joint space narrowing and osteophytic spurring.

Medial: Severe medial compartment degenerative change with
full-thickness cartilage loss, joint space narrowing, osteophytic
spurring and subchondral edema.

Lateral: Moderate to advanced degenerative chondrosis/chondromalacia
with joint space narrowing and osteophytic spurring.

Joint: Moderate-sized joint effusion with severe synovitis and
numerous loose bodies in the joint. There is a 22.5 mm cystic
structure noted just anterior to the anterior horn of the lateral
meniscus which is likely a ganglion cyst or parameniscal cyst.

Popliteal Fossa: No popliteal mass or Baker's cyst. There is fluid
tracking back along the popliteus tendon which contains debris
including large loose bodies.

Extensor Mechanism: The patella retinacular structures are intact
and the quadriceps and patellar tendons are intact. Proximal
patellar tendinopathy noted.

Bones: No acute bony findings. Stress edema noted in the medial
tibia and femur related to the severe degenerative change and torn
and dysfunctional medial meniscus.
IMPRESSION: 1. Fairly extensive horizontal flap type tear involving the mid body
region of the medial meniscus with a flipped meniscal fragment in
the medial gutter.
2. Significant tricompartmental degenerative changes most notable in
the medial compartment.
3. Moderate-sized joint effusion and severe synovitis with numerous
loose bodies in the joint. This could represent synovial
osteochondromatosis.
4. Intact ligamentous structures.

## 2016-03-09 ENCOUNTER — Ambulatory Visit: Payer: Medicaid Other | Attending: Orthopedic Surgery | Admitting: Physical Therapy

## 2016-03-09 ENCOUNTER — Encounter: Payer: Self-pay | Admitting: Physical Therapy

## 2016-03-09 DIAGNOSIS — M6281 Muscle weakness (generalized): Secondary | ICD-10-CM | POA: Diagnosis present

## 2016-03-09 DIAGNOSIS — M25561 Pain in right knee: Secondary | ICD-10-CM | POA: Insufficient documentation

## 2016-03-09 DIAGNOSIS — M25661 Stiffness of right knee, not elsewhere classified: Secondary | ICD-10-CM | POA: Insufficient documentation

## 2016-03-10 NOTE — Therapy (Signed)
[facility] [address], [place], [address] Phone: [phone]   Fax:  [phone]  Physical Therapy Evaluation  Patient Details  Name: Nathan Hood MRN: [id number] Date of Birth: 1966-02-05 Referring Provider: Leroy Kennedy MD  Encounter Date: 03/09/2016      PT End of Session - 03/09/16 1542    Visit Number 1   Number of Visits 9   Date for PT Re-Evaluation 05/05/16   Authorization Time Period  (M-caid)   PT Start Time 1435   PT Stop Time 1530   PT Time Calculation (min) 55 min   Activity Tolerance Patient tolerated treatment well   Behavior During Therapy [facility] for tasks assessed/performed      Past Medical History:  Diagnosis Date  . Anxiety   . Arthritis   . Asthma   . Gallbladder disease   . Head injury   . Hiatal hernia   . Sleep apnea     Past Surgical History:  Procedure Laterality Date  . CHOLECYSTECTOMY    . HERNIA REPAIR     x2  . KNEE SURGERY Right     There were no vitals filed for this visit.       Subjective Assessment - 03/09/16 1448    Subjective Patient reports he had surgery right LE TKA 01/28/2016 and had HHPT and is now referred to out patient PT. He reports his right knee is painful on medial aspect and is popping intermittently. He is feeling stronger in right LE and is walking more and able to bike on stationary bike and is outdoors more. His righ leg does feel weak at times. The knee is still swollen and has increased warmth which is improving.    Pertinent History Onset of knee pain in 2008; Torn meniscus 6 months prior (conformed by MRI) L TKA surgery on 10/08/15; R TKA 01/28/2016   Limitations Sitting;Lifting;Standing;House hold activities;Walking;Other (comment)  stair climbing   Patient Stated Goals get back being active, prior level of function with daily tasks   Currently in Pain? Yes   Pain Score 3    Pain Location Knee   Pain Orientation Right   Pain Descriptors / Indicators Aching;Tightness   Pain Type Surgical pain   Pain Onset More than a month ago   Aggravating Factors  increased activity, standing on hard surfaces (cement)   Pain Relieving Factors ice, elevation   Effect of Pain on Daily Activities limits standing activites            [facility] PT Assessment - 03/09/16 1443      Assessment   Medical Diagnosis Right Knee DJD 715.16/ TKA   Referring Provider Leroy Kennedy MD   Onset Date/Surgical Date 01/28/16   Hand Dominance Right   Next MD Visit unknown   Prior Therapy home health before outpatient     Precautions   Precautions None     Restrictions   Weight Bearing Restrictions No     Home Environment   Living Environment Private residence   Living Arrangements Spouse/significant other   Available Help at Discharge Friend(s);Family   Type of [place] to enter   Entrance Stairs-Number of Steps 3   Entrance Stairs-Rails Can reach both   [place] One level   [place] - 2 wheels;Cane - single point;Bedside commode     Prior Function   Level of Independence Independent   Vocation On  disability   Vocation Requirements N/A   Leisure fishing, exercise(running), and golf     Objective: Gait; ambulating without AD with mild limp on right LE, short step length, decreased push off right and left LE, decreased trunk rotation AROM: left knee flexion 0-125, right knee flexion 0-120 degrees with tight feeling at full ROM Strength:  Right LE: hip flexion 4/5, abduction 4-/5, extension 4-/5, knee extension 4/5, flexion 4-/5 Left LE: hip flexion 4/5, abduction 4/5, extension 4/5, knee extension and flexion at least 4/5 Palpation; Patellar mobility right knee decrease medial/lateral and superior/inferior  Scar mobility decreased distally  Single leg stand:  Right LE 30 seconds, Left 28 seconds  Outcome measures: 10MW 7.8 seconds (WNL for community ambulator) LEFS: 34/64  (moderate self perceived disability)        PT Education - 03/09/16 1531    Education provided Yes   Education Details HEP: ROM, strengthening exercises   Person(s) Educated Patient   Methods Explanation;Verbal cues;Demonstration;Handout   Comprehension Verbalized understanding;Returned demonstration;Verbal cues required             PT Long Term Goals - 03/09/16 1535      PT LONG TERM GOAL #1   Title Pt will be independent with home exercise program aimed at improving hip/knee strength, endurance, and control to better perform prolonged standing activities by 05/05/16 and continue improvement of symptoms after discharge of therapy   Baseline Dependent with exercise performance and progression.    Status New     PT LONG TERM GOAL #2   Title Pt will score >45/64 on the LEFS by 05/05/16 to demonstrate significant improvement in self perceived lower extremity function with reciprocal stair climbing and performing daily tasks with minimal to no difficulty   Baseline LEFS 34/64   Status New               Plan - 03/09/16 1533    Clinical Impression Statement Patient is a 50 year old male who presents  s/p right TKA 01/28/2016. He has decreased ability to perform functional activities such as bending, squatting, and dressing. He demonstrates significant decrease  in the lower extremity function with LEFS score of 34/64 (moderate self perceived disability). He has had home health physical therapy for 2 weeks. Patient reports  increased difficuly walking and transitioning to/from walking surfaces(such as pavement to gravel), prolonged standing (over 76min), squatting, dressing, getting In/out of car. He states pain alleviating factors include medication, elevation and icing the knee. Function is limited by contributing factor of left TKA 10/2015 which he is still recovering from. Patient reports worst pain is a 6/10 and best pain is a 3/10.  He has decreased ROM and strength  right LE. He has limited knowledge of appropriate pain control, progression of exercises in order to be able to walk and perform household  and community activities with less difficulty.    Rehab Potential Good   Clinical Impairments Affecting Rehab Potential (-) medical coverage; (+) age, family support   PT Frequency 1x / week   PT Duration 8 weeks   PT Treatment/Interventions ADLs/Self Care Home Management;Moist Heat;Therapeutic exercise;Manual techniques;Therapeutic activities;Electrical Stimulation;Cryotherapy;Stair training;Gait training;Patient/family education;Passive range of motion;Neuromuscular re-education   PT Next Visit Plan standing balance activies, step ups on low steps, further advancement of standing activities   PT Home Exercise Plan seated exercises for ROm, strengthening hip/knee   Consulted and Agree with Plan of Care Patient      Patient will benefit from skilled therapeutic intervention in  order to improve the following deficits and impairments:  Decreased strength, Decreased balance, Impaired flexibility, Pain, Decreased activity tolerance, Decreased endurance, Difficulty walking, Decreased range of motion  Visit Diagnosis: Muscle weakness (generalized) - Plan: PT plan of care cert/re-cert  Stiffness of right knee, not elsewhere classified - Plan: PT plan of care cert/re-cert  Pain in right knee - Plan: PT plan of care cert/re-cert     Problem List Patient Active Problem List   Diagnosis Date Noted  . Cocaine use (see 10/28/2015 UDS) 11/08/2015  . Controlled substance agreement terminated 11/08/2015  . Arthropathy, traumatic, knee 10/07/2015  . H/O total knee replacement 10/07/2015  . Knee Arthralgia  (Bilateral) 06/17/2015  . Long term prescription opiate use 05/20/2015  . Chronic knee pain (Location of Primary Source of Pain) (Bilateral) (R>L) 05/20/2015  . Bilateral knee osteoarthritis (Severe) (R>L) 05/20/2015  . Chronic constipation 04/21/2015  .  Chronic low back pain 04/21/2015  . Depression 04/21/2015  . Knee Arthropathy (Bilateral) 04/21/2015  . Encounter for therapeutic drug level monitoring 04/21/2015  . Long term current use of opiate analgesic 04/21/2015  . Uncomplicated opioid dependence ([place]) 04/21/2015  . Opiate use (30 MME/Day) 04/21/2015  . Chronic pain 04/21/2015    Jomarie Longs PT 03/10/2016, 8:25 PM   [place] [facility] [address] [place], [address] Phone: [phone]   Fax:  [phone]  Name: Nathan Hood MRN: [id number] Date of Birth: 16-Nov-1965

## 2016-03-22 ENCOUNTER — Ambulatory Visit: Payer: Medicaid Other | Admitting: Physical Therapy

## 2016-03-24 ENCOUNTER — Ambulatory Visit: Payer: Medicaid Other | Admitting: Physical Therapy

## 2016-03-28 ENCOUNTER — Ambulatory Visit: Payer: Medicaid Other | Admitting: Physical Therapy

## 2016-03-28 ENCOUNTER — Encounter: Payer: Self-pay | Admitting: Physical Therapy

## 2016-03-28 DIAGNOSIS — M6281 Muscle weakness (generalized): Secondary | ICD-10-CM | POA: Diagnosis not present

## 2016-03-28 DIAGNOSIS — M25561 Pain in right knee: Secondary | ICD-10-CM

## 2016-03-28 DIAGNOSIS — M25661 Stiffness of right knee, not elsewhere classified: Secondary | ICD-10-CM

## 2016-03-29 NOTE — Therapy (Signed)
[place] [facility] [address] [place], [address] Phone: [phone]   Fax:  [phone]  Physical Therapy Treatment  Patient Details  Name: Nathan Hood MRN: [id number] Date of Birth: [birth year] Referring Provider: Leroy Kennedy MD  Encounter Date: 03/28/2016      PT End of Session - 03/28/16 1620    Visit Number 2   Number of Visits 6   Date for PT Re-Evaluation 05/05/16   Authorization Type 1   Authorization Time Period 5   PT Start Time 1620   PT Stop Time 1655   PT Time Calculation (min) 35 min   Activity Tolerance Patient limited by pain   Behavior During Therapy [facility] for tasks assessed/performed      Past Medical History:  Diagnosis Date  . Anxiety   . Arthritis   . Asthma   . Gallbladder disease   . Head injury   . Hiatal hernia   . Sleep apnea     Past Surgical History:  Procedure Laterality Date  . CHOLECYSTECTOMY    . HERNIA REPAIR     x2  . KNEE SURGERY Right     There were no vitals filed for this visit.      Subjective Assessment - 03/28/16 1623    Subjective increased soreness due to playing golf last week. Pain is in anterior inferior aspect of right knee more to inside. Pain is worse with knee extension.   Limitations Sitting;Lifting;Standing;House hold activities;Walking;Other (comment)   Patient Stated Goals get back being active, prior level of function with daily tasks   Currently in Pain? Yes   Pain Score 3    Pain Location Knee   Pain Orientation Right   Pain Descriptors / Indicators Aching   Pain Type Surgical pain   Pain Onset More than a month ago     Objective: Observation; Right knee moderate swelling with increased warmth inferior to patella AAROM: right knee flexion to 130 degrees Palpation; point tender with decreased soft tissue mobility distal quadriceps above patella and inferior to patella, medial aspect of tendon  Treatment;  Manual  therapy:  Soft tissue mobilization/joint mobilization; Right knee PF joint mobilization with patella mobilizer for distraction and inferior/superior glides x 3 reps: STM distal quadriceps muscle just superior to patella superficial techniques, patella tendon superficial technique, cross friction technique with knee in extension; instructed patient to perform x 5 min./day along with ice and use of patellar tendon strap   Therapeutic exercise; patient performed exercises with guidance, verbal and tactile cues and demonstration of PT: Supine lying: using 55cm physioball for hip/knee flexion x 10 Quad sets x 10 Attempted knee extension, unable to perform due to increased knee pain Sitting: Hip adduction with ball with glute sets x 15 reps  Patient response to treatment: decreased pain minimally following treatment due to inflammation, patient verbalized understanding of pain control strategies, self massage to Patella tendon and appropriate exercises to avoid increased pain/irritation to tendon  Patient education; as listed above       PT Long Term Goals - 03/09/16 1535      PT LONG TERM GOAL #1   Title Pt will be independent with home exercise program aimed at improving hip/knee strength, endurance, and control to better perform prolonged standing activities by 05/05/16 and continue improvement of symptoms after discharge of therapy   Baseline Dependent with exercise performance and progression.    Status New     PT  LONG TERM GOAL #2   Title Pt will score >45/64 on the LEFS by 05/05/16 to demonstrate significant improvement in self perceived lower extremity function with reciprocal stair climbing and performing daily tasks with minimal to no difficulty   Baseline LEFS 34/64   Status New               Plan - 03/28/16 1700    Clinical Impression Statement Patient limited by pain therefore limited exercises to being non painful. He was able to achieve some relief with STM and use of  patellar tendon strap. He demonstrated good understanding of pain control strategies and will continue for progressive exercises as tolerated next session.   Rehab Potential Good   PT Frequency 1x / week   PT Duration 8 weeks   PT Treatment/Interventions ADLs/Self Care Home Management;Moist Heat;Therapeutic exercise;Manual techniques;Therapeutic activities;Electrical Stimulation;Cryotherapy;Stair training;Gait training;Patient/family education;Passive range of motion;Neuromuscular re-education   PT Next Visit Plan standing balance activies, step ups on low steps, further advancement of standing activities as tolerated, limited by pain   PT Home Exercise Plan seated exercises for ROM, strengthening hip/knee; pain control/STM      Patient will benefit from skilled therapeutic intervention in order to improve the following deficits and impairments:  Decreased strength, Decreased balance, Impaired flexibility, Pain, Decreased activity tolerance, Decreased endurance, Difficulty walking, Decreased range of motion  Visit Diagnosis: Muscle weakness (generalized)  Stiffness of right knee, not elsewhere classified  Pain in right knee     Problem List Patient Active Problem List   Diagnosis Date Noted  . Cocaine use (see 10/28/2015 UDS) 11/08/2015  . Controlled substance agreement terminated 11/08/2015  . Arthropathy, traumatic, knee 10/07/2015  . H/O total knee replacement 10/07/2015  . Knee Arthralgia  (Bilateral) 06/17/2015  . Long term prescription opiate use 05/20/2015  . Chronic knee pain (Location of Primary Source of Pain) (Bilateral) (R>L) 05/20/2015  . Bilateral knee osteoarthritis (Severe) (R>L) 05/20/2015  . Chronic constipation 04/21/2015  . Chronic low back pain 04/21/2015  . Depression 04/21/2015  . Knee Arthropathy (Bilateral) 04/21/2015  . Encounter for therapeutic drug level monitoring 04/21/2015  . Long term current use of opiate analgesic 04/21/2015  . Uncomplicated  opioid dependence ([place]) 04/21/2015  . Opiate use (30 MME/Day) 04/21/2015  . Chronic pain 04/21/2015    Nathan Hood PT 03/29/2016, 4:18 PM  [facility] [facility] [address] [place], [address] Phone: [phone]   Fax:  [phone]  Name: Nathan Hood MRN: [id number] Date of Birth: 1965/09/08

## 2016-03-31 ENCOUNTER — Encounter: Payer: Medicaid Other | Admitting: Physical Therapy

## 2016-04-05 ENCOUNTER — Ambulatory Visit: Payer: Medicaid Other | Admitting: Physical Therapy

## 2016-04-12 ENCOUNTER — Ambulatory Visit: Payer: Medicaid Other | Attending: Orthopedic Surgery | Admitting: Physical Therapy

## 2016-04-12 DIAGNOSIS — M6281 Muscle weakness (generalized): Secondary | ICD-10-CM | POA: Insufficient documentation

## 2016-04-12 DIAGNOSIS — M25661 Stiffness of right knee, not elsewhere classified: Secondary | ICD-10-CM | POA: Diagnosis present

## 2016-04-13 ENCOUNTER — Encounter: Payer: Medicaid Other | Admitting: Physical Therapy

## 2016-04-13 NOTE — Therapy (Signed)
[place] [facility] [address], [place], [address] Phone: [phone]   Fax:  [phone]  Physical Therapy Treatment/Discharge Summary  Patient Details  Name: Nathan Hood MRN: [id number] Date of Birth: 11/02/1965 Referring Provider: Leroy Kennedy MD  Encounter Date: 04/12/2016   Patient began physical therapy 03/09/2016 and attended 3 sessions through 04/12/2016. Patient has achieved full ROM and strength in right LE and is independent with home program and should continue to progress with self management of HEP. All goals have been achieved.       PT End of Session - 04/12/16 1615    Visit Number 3   Number of Visits 6   Date for PT Re-Evaluation 05/05/16   Authorization Type 2   Authorization Time Period 5   PT Start Time 1530   PT Stop Time 1613   PT Time Calculation (min) 43 min   Activity Tolerance Patient tolerated treatment well   Behavior During Therapy [facility] for tasks assessed/performed      Past Medical History:  Diagnosis Date  . Anxiety   . Arthritis   . Asthma   . Gallbladder disease   . Head injury   . Hiatal hernia   . Sleep apnea     Past Surgical History:  Procedure Laterality Date  . CHOLECYSTECTOMY    . HERNIA REPAIR     x2  . KNEE SURGERY Right     There were no vitals filed for this visit.      Subjective Assessment - 04/12/16 1534    Subjective Improved flexibility, strength and is able to do most activities including golf with minimal difficulty. He agrees to discharge with independent self management and home program.    Limitations Sitting;Lifting;Standing;House hold activities;Walking;Other (comment)   Patient Stated Goals get back being active, prior level of function with daily tasks   Currently in Pain? No/denies      Objective: Gait; Independent without AD and no obvious deviations AROM: right knee flexion 0-130  (initially was 0-120) Strength:   Right LE: hip flexion 4/5, abduction 4/5, extension 4/5, knee extension 4/5, flexion 4/5 Left LE: hip flexion 4/5, abduction 4/5, extension 4/5, knee extension and flexion at least 4/5 Single leg stand:  Right and left LE 30 seconds; eyes closed patient reported feeling instability in hip region bilaterally  Outcome measures: LEFS: 34/64 (moderate self perceived disability) Currently 53/64 (mild self perceived disability)   Treatment: therapeutic exercise:patient performed exercises with guidance, verbal and tactile cues and demonstration of PT: Sitting: SLR long sitting with foot turned out hold 1" off table and hold for count of 10 x 5 reps each LE Knee flexion with resistive band x 25 reps Sit to stand  With ball between knees x 10 off elevated surface Sit to stand with resistive band around thighs x 10 Side step along counter with resistive band around thighs x 2 min. Single leg standing with eyes open and closed: using UE's as needed for safety focus on stability Walk forward, backward and side stepping with black resistive bands: demonstrated and given (2) bands to exercise with at home  Patient response to treatment: patient demonstrated good understanding of all exercises with minimal cuing for correct positioning        PT Education - 04/12/16 1541    Education provided Yes   Education Details HEP: re assessed exercises to continue for home including quadriceps and hamstring strengthening, balance, walking exercises  Person(s) Educated Patient   Methods Explanation;Demonstration;Verbal cues   Comprehension Verbalized understanding;Returned demonstration;Verbal cues required             PT Long Term Goals - 04/13/16 2221      PT LONG TERM GOAL #1   Title Pt will be independent with home exercise program aimed at improving hip/knee strength, endurance, and control to better perform prolonged standing activities by 05/05/16 and continue improvement of symptoms  after discharge of therapy   Baseline Dependent with exercise performance and progression. Currently independent with exercises   Status Achieved     PT LONG TERM GOAL #2   Title Pt will score >45/64 on the LEFS by 05/05/16 to demonstrate significant improvement in self perceived lower extremity function with reciprocal stair climbing and performing daily tasks with minimal to no difficulty   Baseline LEFS 34/64: Current LEFS 04/12/16: 53/64   Status Achieved               Plan - 04/12/16 1630    Clinical Impression Statement Patient demonstrates good understanding of exercises and has full ROM and strength right LE and should continue to progress with self management/home program. All goals have been achieved.    Rehab Potential Good   PT Frequency 1x / week   PT Duration 6 weeks   PT Treatment/Interventions ADLs/Self Care Home Management;Moist Heat;Therapeutic exercise;Manual techniques;Therapeutic activities;Electrical Stimulation;Cryotherapy;Stair training;Gait training;Patient/family education;Passive range of motion;Neuromuscular re-education   PT Next Visit Plan discharge from physical therapy   PT Home Exercise Plan Exercises for flexibility and strength   Consulted and Agree with Plan of Care Patient      Patient will benefit from skilled therapeutic intervention in order to improve the following deficits and impairments:  Decreased strength, Decreased balance, Impaired flexibility, Pain, Decreased activity tolerance, Decreased endurance, Difficulty walking, Decreased range of motion  Visit Diagnosis: Muscle weakness (generalized)  Stiffness of right knee, not elsewhere classified     Problem List Patient Active Problem List   Diagnosis Date Noted  . Cocaine use (see 10/28/2015 UDS) 11/08/2015  . Controlled substance agreement terminated 11/08/2015  . Arthropathy, traumatic, knee 10/07/2015  . H/O total knee replacement 10/07/2015  . Knee Arthralgia   (Bilateral) 06/17/2015  . Long term prescription opiate use 05/20/2015  . Chronic knee pain (Location of Primary Source of Pain) (Bilateral) (R>L) 05/20/2015  . Bilateral knee osteoarthritis (Severe) (R>L) 05/20/2015  . Chronic constipation 04/21/2015  . Chronic low back pain 04/21/2015  . Depression 04/21/2015  . Knee Arthropathy (Bilateral) 04/21/2015  . Encounter for therapeutic drug level monitoring 04/21/2015  . Long term current use of opiate analgesic 04/21/2015  . Uncomplicated opioid dependence ([place]) 04/21/2015  . Opiate use (30 MME/Day) 04/21/2015  . Chronic pain 04/21/2015    Jomarie Longs PT 04/13/2016, 10:31 PM  [place] [facility] [address] [place], [address] Phone: [phone]   Fax:  [phone]  Name: Nathan Hood MRN: [id number] Date of Birth: [birth year]

## 2016-04-19 ENCOUNTER — Encounter: Payer: Medicaid Other | Admitting: Physical Therapy

## 2016-04-26 ENCOUNTER — Encounter: Payer: Medicaid Other | Admitting: Physical Therapy

## 2016-05-03 ENCOUNTER — Encounter: Payer: Medicaid Other | Admitting: Physical Therapy

## 2019-11-07 ENCOUNTER — Emergency Department: Payer: Medicaid Other

## 2019-11-07 ENCOUNTER — Emergency Department
Admission: EM | Admit: 2019-11-07 | Discharge: 2019-11-07 | Disposition: A | Discharge status: Home / Self Care | Payer: Medicaid Other | Attending: Emergency Medicine | Admitting: Emergency Medicine

## 2019-11-07 ENCOUNTER — Other Ambulatory Visit: Payer: Self-pay

## 2019-11-07 ENCOUNTER — Encounter: Payer: Self-pay | Admitting: Radiology

## 2019-11-07 DIAGNOSIS — S0990XA Unspecified injury of head, initial encounter: Secondary | ICD-10-CM | POA: Diagnosis not present

## 2019-11-07 DIAGNOSIS — Z23 Encounter for immunization: Secondary | ICD-10-CM | POA: Diagnosis not present

## 2019-11-07 DIAGNOSIS — Y92410 Unspecified street and highway as the place of occurrence of the external cause: Secondary | ICD-10-CM | POA: Insufficient documentation

## 2019-11-07 DIAGNOSIS — Y9389 Activity, other specified: Secondary | ICD-10-CM | POA: Diagnosis not present

## 2019-11-07 DIAGNOSIS — S6992XA Unspecified injury of left wrist, hand and finger(s), initial encounter: Secondary | ICD-10-CM | POA: Insufficient documentation

## 2019-11-07 DIAGNOSIS — M546 Pain in thoracic spine: Secondary | ICD-10-CM

## 2019-11-07 DIAGNOSIS — S63502A Unspecified sprain of left wrist, initial encounter: Secondary | ICD-10-CM | POA: Diagnosis not present

## 2019-11-07 DIAGNOSIS — S0001XA Abrasion of scalp, initial encounter: Secondary | ICD-10-CM | POA: Diagnosis not present

## 2019-11-07 DIAGNOSIS — Y999 Unspecified external cause status: Secondary | ICD-10-CM | POA: Insufficient documentation

## 2019-11-07 DIAGNOSIS — S299XXA Unspecified injury of thorax, initial encounter: Secondary | ICD-10-CM | POA: Diagnosis present

## 2019-11-07 LAB — CBC WITH DIFFERENTIAL/PLATELET
Abs Immature Granulocytes: 0.08 10*3/uL — ABNORMAL HIGH (ref 0.00–0.07)
Basophils Absolute: 0 10*3/uL (ref 0.0–0.1)
Basophils Relative: 0 %
Eosinophils Absolute: 0.2 10*3/uL (ref 0.0–0.5)
Eosinophils Relative: 2 %
HCT: 46 % (ref 39.0–52.0)
Hemoglobin: 16.1 g/dL (ref 13.0–17.0)
Immature Granulocytes: 1 %
Lymphocytes Relative: 18 %
Lymphs Abs: 1.5 10*3/uL (ref 0.7–4.0)
MCH: 34.8 pg — ABNORMAL HIGH (ref 26.0–34.0)
MCHC: 35 g/dL (ref 30.0–36.0)
MCV: 99.6 fL (ref 80.0–100.0)
Monocytes Absolute: 0.6 10*3/uL (ref 0.1–1.0)
Monocytes Relative: 7 %
Neutro Abs: 5.7 10*3/uL (ref 1.7–7.7)
Neutrophils Relative %: 72 %
Platelets: 238 10*3/uL (ref 150–400)
RBC: 4.62 MIL/uL (ref 4.22–5.81)
RDW: 13.2 % (ref 11.5–15.5)
WBC: 7.9 10*3/uL (ref 4.0–10.5)
nRBC: 0 % (ref 0.0–0.2)

## 2019-11-07 LAB — COMPREHENSIVE METABOLIC PANEL
ALT: 154 U/L — ABNORMAL HIGH (ref 0–44)
AST: 105 U/L — ABNORMAL HIGH (ref 15–41)
Albumin: 4.2 g/dL (ref 3.5–5.0)
Alkaline Phosphatase: 60 U/L (ref 38–126)
Anion gap: 9 (ref 5–15)
BUN: 19 mg/dL (ref 6–20)
CO2: 24 mmol/L (ref 22–32)
Calcium: 9.3 mg/dL (ref 8.9–10.3)
Chloride: 105 mmol/L (ref 98–111)
Creatinine, Ser: 0.95 mg/dL (ref 0.61–1.24)
GFR calc Af Amer: >60 mL/min (ref >60)
GFR calc non Af Amer: >60 mL/min (ref >60)
Glucose, Bld: 140 mg/dL — ABNORMAL HIGH (ref 70–99)
Potassium: 3.9 mmol/L (ref 3.5–5.1)
Sodium: 138 mmol/L (ref 135–145)
Total Bilirubin: 0.9 mg/dL (ref 0.3–1.2)
Total Protein: 7 g/dL (ref 6.5–8.1)

## 2019-11-07 MED ORDER — HYDROMORPHONE BOLUS VIA INFUSION: 1.0000 mg | Freq: Once | INTRAVENOUS | Status: DC

## 2019-11-07 MED ORDER — HYDROMORPHONE HCL 1 MG/ML IJ SOLN
1.0000 mg | Freq: Once | INTRAMUSCULAR | Status: AC
  Administered 2019-11-07: 17:00:00 1 mg via INTRAVENOUS
  Filled 2019-11-07: qty 1

## 2019-11-07 MED ORDER — HYDROMORPHONE HCL 1 MG/ML IJ SOLN
1.0000 mg | Freq: Once | INTRAMUSCULAR | Status: AC
  Administered 2019-11-07: 1 mg via INTRAVENOUS
  Filled 2019-11-07: qty 1

## 2019-11-07 MED ORDER — IOHEXOL 350 MG/ML SOLN
100.0000 mL | Freq: Once | INTRAVENOUS | Status: AC | PRN
  Administered 2019-11-07: 100 mL via INTRAVENOUS

## 2019-11-07 MED ORDER — OXYCODONE-ACETAMINOPHEN 5-325 MG PO TABS: 1.0000 | ORAL_TABLET | Freq: Three times a day (TID) | ORAL | 0 refills | Status: DC | PRN

## 2019-11-07 MED ORDER — TETANUS-DIPHTH-ACELL PERTUSSIS 5-2.5-18.5 LF-MCG/0.5 IM SUSP
0.5000 mL | Freq: Once | INTRAMUSCULAR | Status: AC
  Administered 2019-11-07: 0.5 mL via INTRAMUSCULAR
  Filled 2019-11-07: qty 0.5

## 2019-11-07 NOTE — ED Notes (Signed)
EDP Williams at bedside.  

## 2019-11-07 NOTE — ED Notes (Signed)
Pt ambulatory to toilet independently with a steady gait. Pt abc on bed safely
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
## 2019-11-27 ENCOUNTER — Ambulatory Visit: Payer: Medicaid Other | Attending: Internal Medicine

## 2019-12-21 ENCOUNTER — Ambulatory Visit: Payer: Medicaid Other | Attending: Internal Medicine

## 2019-12-21 DIAGNOSIS — Z23 Encounter for immunization: Secondary | ICD-10-CM

## 2019-12-21 NOTE — Progress Notes (Signed)
   Covid-19 Vaccination Clinic  Name:  Nathan Hood    MRN: [id number] DOB: 17-Jan-1966  12/21/2019  [name] was observed post Covid-19 immunization for 15 minutes without incident. He was provided with Vaccine Information Sheet and instruction to access the V-Safe system.   [name] was instructed to call 911 with any severe reactions post vaccine: Marland Kitchen Difficulty breathing  . Swelling of face and throat  . A fast heartbeat  . A bad rash all over body  . Dizziness and weakness   Immunizations Administered    Name Date Dose VIS Date Route   Pfizer COVID-19 Vaccine 12/21/2019 11:38 AM 0.3 mL 08/28/2018 Intramuscular   Manufacturer: [place]   Lot: [id number]   [place]: [id number]

## 2020-01-11 ENCOUNTER — Other Ambulatory Visit: Payer: Self-pay

## 2020-01-11 ENCOUNTER — Ambulatory Visit: Payer: Medicaid Other | Attending: Internal Medicine

## 2020-01-11 DIAGNOSIS — Z23 Encounter for immunization: Secondary | ICD-10-CM

## 2020-01-11 NOTE — Progress Notes (Signed)
   Covid-19 Vaccination Clinic  Name:  JASEN HARTSTEIN    MRN: [id number] DOB: 10/02/1965  01/11/2020  Mr. Miklos was observed post Covid-19 immunization for 15 minutes without incident. He was provided with Vaccine Information Sheet and instruction to access the V-Safe system.   Mr. Cortese was instructed to call 911 with any severe reactions post vaccine: Marland Kitchen Difficulty breathing  . Swelling of face and throat  . A fast heartbeat  . A bad rash all over body  . Dizziness and weakness   Immunizations Administered    Name Date Dose VIS Date Route   Pfizer COVID-19 Vaccine 01/11/2020 10:39 AM 0.3 mL 08/28/2018 Intramuscular   Manufacturer: [place]   Lot: [id number]   [place]: [id number]

## 2020-04-08 ENCOUNTER — Other Ambulatory Visit: Payer: Self-pay

## 2020-04-08 ENCOUNTER — Ambulatory Visit (INDEPENDENT_AMBULATORY_CARE_PROVIDER_SITE_OTHER): Payer: Medicaid Other | Admitting: Dermatology

## 2020-04-08 DIAGNOSIS — D22112 Melanocytic nevi of right lower eyelid, including canthus: Secondary | ICD-10-CM

## 2020-04-08 DIAGNOSIS — L918 Other hypertrophic disorders of the skin: Secondary | ICD-10-CM | POA: Diagnosis not present

## 2020-04-08 DIAGNOSIS — L820 Inflamed seborrheic keratosis: Secondary | ICD-10-CM

## 2020-04-08 DIAGNOSIS — L578 Other skin changes due to chronic exposure to nonionizing radiation: Secondary | ICD-10-CM

## 2020-04-08 DIAGNOSIS — L821 Other seborrheic keratosis: Secondary | ICD-10-CM

## 2020-04-08 DIAGNOSIS — D229 Melanocytic nevi, unspecified: Secondary | ICD-10-CM

## 2020-04-08 DIAGNOSIS — L814 Other melanin hyperpigmentation: Secondary | ICD-10-CM

## 2020-04-08 NOTE — Progress Notes (Signed)
New Patient Visit  Subjective  Nathan Hood Folks is a 54 y.o. male who presents for the following: Lesions (around the neck, R eye, and waistline - itches at times).  The following portions of the chart were reviewed this encounter and updated as appropriate:  Tobacco  Allergies  Meds  Problems  Med Hx  Surg Hx  Fam Hx     Review of Systems:  No other skin or systemic complaints except as noted in HPI or Assessment and Plan.  Objective  Well appearing patient in no apparent distress; mood and affect are within normal limits.  A focused examination was performed including the face, trunk, and extremties . Relevant physical exam findings are noted in the Assessment and Plan.  Objective  R med canthus: Brown macule   Images    Objective  trunk, axilla (21): Erythematous keratotic or waxy stuck-on papule or plaque.   Assessment & Plan  Nevus R medial lower eyelid Irritating to patient - discussed shave removal/biopsy, patient would like to schedule.  May need ocular shield for procedure.  Inflamed seborrheic keratosis (21) trunk, axilla  Destruction of lesion - trunk, axilla Complexity: simple   Destruction method: cryotherapy   Informed consent: discussed and consent obtained   Timeout:  patient name, date of birth, surgical site, and procedure verified Lesion destroyed using liquid nitrogen: Yes   Region frozen until ice ball extended beyond lesion: Yes   Outcome: patient tolerated procedure well with no complications   Post-procedure details: wound care instructions given     Acrochordons (Skin Tags) - Removal desired by patient - Fleshy, skin-colored pedunculated papules - Benign appearing.  - Patient desires removal. Reviewed that this is not covered by insurance and they will be charged a cosmetic fee for removal. Patient signed non-covered consent.  - Prior to the procedure, reviewed the expected small wound. Also reviewed the risk of leaving a small scar  and the small risk of infection.  PROCEDURE - The areas were prepped with isopropyl alcohol. A small amount of lidocaine 1% with epinephrine was injected at the base of each lesion to achieve good local anesthesia. The skin tags were removed using a snip technique. Aluminum chloride was used for hemostasis. Petrolatum and a bandage were applied. The procedure was tolerated well. - Wound care was reviewed with the patient. They were advised to call with any concerns. Total number of treated acrochordons - R axilla x 3, neck x 6, L axilla x 2, waistline x 1  Seborrheic Keratoses - Stuck-on, waxy, tan-brown papules and plaques  - Discussed benign etiology and prognosis. - Observe - Call for any changes  Actinic Damage - diffuse scaly erythematous macules with underlying dyspigmentation - Recommend daily broad spectrum sunscreen SPF 30+ to sun-exposed areas, reapply every 2 hours as needed.  - Call for new or changing lesions.  Lentigines - Scattered tan macules - Discussed due to sun exposure - Benign, observe - Call for any changes  Melanocytic Nevi - Tan-brown and/or pink-flesh-colored symmetric macules and papules - Benign appearing on exam today - Observation - Call clinic for new or changing moles - Recommend daily use of broad spectrum spf 30+ sunscreen to sun-exposed areas.   Return for biopsy R med canthus - schedule on a Tuesday .  Luther Redo, CMA, am acting as scribe for Sarina Ser, MD .  Documentation: I have reviewed the above documentation for accuracy and completeness, and I agree with the above.  Sarina Ser, MD

## 2020-04-09 ENCOUNTER — Encounter: Payer: Self-pay | Admitting: Dermatology

## 2020-04-28 ENCOUNTER — Other Ambulatory Visit: Payer: Self-pay

## 2020-04-28 ENCOUNTER — Ambulatory Visit (INDEPENDENT_AMBULATORY_CARE_PROVIDER_SITE_OTHER): Payer: Medicaid Other | Admitting: Dermatology

## 2020-04-28 DIAGNOSIS — D485 Neoplasm of uncertain behavior of skin: Secondary | ICD-10-CM | POA: Diagnosis not present

## 2020-04-28 DIAGNOSIS — C44111 Basal cell carcinoma of skin of unspecified eyelid, including canthus: Secondary | ICD-10-CM

## 2020-04-28 DIAGNOSIS — D492 Neoplasm of unspecified behavior of bone, soft tissue, and skin: Secondary | ICD-10-CM

## 2020-04-28 HISTORY — DX: Basal cell carcinoma of skin of unspecified eyelid, including canthus: C44.111

## 2020-04-28 NOTE — Progress Notes (Signed)
   Follow-Up Visit   Subjective  Nathan Hood is a 54 y.o. male who presents for the following: Irritated Nevus (R medial lower eyelid, pr presents for removal).  The following portions of the chart were reviewed this encounter and updated as appropriate:  Tobacco  Allergies  Meds  Problems  Med Hx  Surg Hx  Fam Hx     Review of Systems:  No other skin or systemic complaints except as noted in HPI or Assessment and Plan.  Objective  Well appearing patient in no apparent distress; mood and affect are within normal limits.  A focused examination was performed including face. Relevant physical exam findings are noted in the Assessment and Plan.  Objective  Right medial lower eyelid: Brown macule 0.6 x 0.3cm        Assessment & Plan  Neoplasm of skin Right medial lower eyelid Eye numbing drops; lubricant and eye shield placed prior to procedure.  Skin excision  Lesion length (cm):  0.6 Lesion width (cm):  0.3 Margin per side (cm):  0.1 Total excision diameter (cm):  0.8 Informed consent: discussed and consent obtained   Timeout: patient name, date of birth, surgical site, and procedure verified   Procedure prep:  Patient was prepped and draped in usual sterile fashion Prep type:  Isopropyl alcohol and povidone-iodine Anesthesia: the lesion was anesthetized in a standard fashion   Anesthesia comment:  Alcaine drops Anesthetic:  1% lidocaine w/ epinephrine 1-100,000 buffered w/ 8.4% NaHCO3 Instrument used: scissors   Hemostasis achieved with: pressure and aluminum chloride   Hemostasis achieved with comment:  Electrocautery Outcome: patient tolerated procedure well with no complications   Post-procedure details comment:  Mupirocin applied Dressing: Mupirocin.   Additional details:  2ndary intention healing  Specimen 1 - Surgical pathology Differential Diagnosis: D48.5 Irritated nevus r/o Atypia Check Margins: No Brown macule 0.6 x 0.3cm  Irritated Nevus  r/o Atypia, bx/eyelid excision today  Return if symptoms worsen or fail to improve.   I, Othelia Pulling, RMA, am acting as scribe for Sarina Ser, MD .  Documentation: I have reviewed the above documentation for accuracy and completeness, and I agree with the above.  Sarina Ser, MD

## 2020-04-28 NOTE — Patient Instructions (Signed)

## 2020-04-29 ENCOUNTER — Encounter: Payer: Self-pay | Admitting: Dermatology

## 2020-05-07 ENCOUNTER — Telehealth: Payer: Self-pay

## 2020-05-07 NOTE — Telephone Encounter (Signed)
LM on VM to return my call. 

## 2020-05-07 NOTE — Telephone Encounter (Signed)
-----   Message from Ralene Bathe, MD sent at 05/05/2020  7:12 PM EDT ----- Diagnosis Skin , right medial lower eyelid BASAL CELL CARCINOMA, NODULAR PATTERN  Cancer - [facility] May need further treatment Will review with pathologist Make pt appt in 3-4 weeks to examine eyelid and discuss further

## 2020-05-11 ENCOUNTER — Telehealth: Payer: Self-pay

## 2020-05-11 NOTE — Telephone Encounter (Signed)
LM on VM to return my call. 

## 2020-05-11 NOTE — Telephone Encounter (Signed)
Patient advised of biopsy results and scheduled for follow up.

## 2020-05-11 NOTE — Telephone Encounter (Signed)
-----   Message from Ralene Bathe, MD sent at 05/05/2020  7:12 PM EDT ----- Diagnosis Skin , right medial lower eyelid BASAL CELL CARCINOMA, NODULAR PATTERN  Cancer - [facility] May need further treatment Will review with pathologist Make pt appt in 3-4 weeks to examine eyelid and discuss further

## 2020-06-11 ENCOUNTER — Other Ambulatory Visit: Payer: Self-pay

## 2020-06-11 ENCOUNTER — Encounter: Payer: Self-pay | Admitting: Dermatology

## 2020-06-11 ENCOUNTER — Ambulatory Visit (INDEPENDENT_AMBULATORY_CARE_PROVIDER_SITE_OTHER): Payer: Medicaid Other | Admitting: Dermatology

## 2020-06-11 DIAGNOSIS — C441122 Basal cell carcinoma of skin of right lower eyelid, including canthus: Secondary | ICD-10-CM | POA: Diagnosis not present

## 2020-06-11 NOTE — Progress Notes (Signed)
   Follow-Up Visit   Subjective  Nathan Hood is a 54 y.o. male who presents for the following: BCC bx proven (R medial lower eyeld, bx 04/28/20).  The following portions of the chart were reviewed this encounter and updated as appropriate:   Tobacco  Allergies  Meds  Problems  Med Hx  Surg Hx  Fam Hx     Review of Systems:  No other skin or systemic complaints except as noted in HPI or Assessment and Plan.  Objective  Well appearing patient in no apparent distress; mood and affect are within normal limits.  A focused examination was performed including face. Relevant physical exam findings are noted in the Assessment and Plan.  Objective  Right medial Lower Eyelid: Pink bx site        Assessment & Plan  Basal cell carcinoma (BCC) of skin of right lower eyelid including canthus Right medial Lower Eyelid Biopsy proven - advised pt this has not been definitively treated and pathology shows margins involved with BCC extending to peripheral and deep margins.  Discussed treatment options: 1-re biopsy and see if any cancer cells still there  - if biopsy is positive, options include surgery ([facility]) vs  treatment with topical anticancer cream (Imiquimod) Or 2- No re biopsy and just treat with topical cream (Imiquimod)  Will plan re biopsy on f/u (after holidays)  Pt states he had original growth for years on eyelid.  See photos.  Top photo shows area unmarked. Bottom photo shows area with blue dot at [facility] biopsy spot.  Return in about 1 month (around 07/12/2020) for re-bx of [place] R lower medial eyelid with eye shield on tuesday at 4:30.  I, Othelia Pulling, RMA, am acting as scribe for Sarina Ser, MD .  Documentation: I have reviewed the above documentation for accuracy and completeness, and I agree with the above.  Sarina Ser, MD

## 2020-06-17 ENCOUNTER — Encounter: Payer: Self-pay | Admitting: Dermatology

## 2020-07-21 ENCOUNTER — Other Ambulatory Visit: Payer: Self-pay

## 2020-07-21 ENCOUNTER — Ambulatory Visit (INDEPENDENT_AMBULATORY_CARE_PROVIDER_SITE_OTHER): Payer: Medicaid Other | Admitting: Dermatology

## 2020-07-21 DIAGNOSIS — C441122 Basal cell carcinoma of skin of right lower eyelid, including canthus: Secondary | ICD-10-CM

## 2020-07-21 MED ORDER — MUPIROCIN 2 % EX OINT: TOPICAL_OINTMENT | CUTANEOUS | 0 refills | Status: AC

## 2020-07-21 NOTE — Patient Instructions (Signed)

## 2020-07-21 NOTE — Progress Notes (Unsigned)
   Follow-Up Visit   Subjective  Nathan Hood is a 55 y.o. male who presents for the following: biopsy proven BCC  (Patient is here today for an additional biopsy of the R med lower eyelid).  The following portions of the chart were reviewed this encounter and updated as appropriate:   Tobacco  Allergies  Meds  Problems  Med Hx  Surg Hx  Fam Hx     Review of Systems:  No other skin or systemic complaints except as noted in HPI or Assessment and Plan.  Objective  Well appearing patient in no apparent distress; mood and affect are within normal limits.  A focused examination was performed including the face. Relevant physical exam findings are noted in the Assessment and Plan.  Objective  R medial lower eyelid 1.0 cm from the medial canthus: Pink biopsy site. Original size 0.6 x 0.3 cm.     Assessment & Plan  Basal cell carcinoma (BCC) of skin of right lower eyelid including canthus R medial lower eyelid 1.0 cm from the medial canthus  Skin / nail biopsy Type of biopsy: tangential   Informed consent: discussed and consent obtained   Timeout: patient name, date of birth, surgical site, and procedure verified   Procedure prep:  Patient was prepped and draped in usual sterile fashion Prep type:  Isopropyl alcohol Anesthesia: the lesion was anesthetized in a standard fashion   Anesthetic:  1% lidocaine w/ epinephrine 1-100,000 buffered w/ 8.4% NaHCO3 Instrument used: flexible razor blade   Hemostasis achieved with: pressure, aluminum chloride and electrodesiccation   Outcome: patient tolerated procedure well   Post-procedure details: sterile dressing applied and wound care instructions given   Dressing type: bandage and petrolatum    mupirocin ointment (BACTROBAN) 2 %  Specimen 1 - Surgical pathology Differential Diagnosis: D48.5 biopsy proven BCC Check Margins: Yes [id number]  Biopsy proven - advised pt this has not been definitively treated and pathology  shows margins involved with BCC extending to peripheral and deep margins.   Discussed treatment options: 1-re biopsy and see if any cancer cells still there  - if biopsy is positive, options include surgery ([facility]) vs  treatment with topical anticancer cream (Imiquimod) Or 2- No re biopsy and just treat with topical cream (Imiquimod)   Start Mupirocin 2% ointment to aa's QD until healed.   Return pending biopsy results.  Luther Redo, CMA, am acting as scribe for Sarina Ser, MD .  Documentation: I have reviewed the above documentation for accuracy and completeness, and I agree with the above.  Sarina Ser, MD

## 2020-07-22 ENCOUNTER — Encounter: Payer: Self-pay | Admitting: Dermatology

## 2020-07-27 ENCOUNTER — Telehealth: Payer: Self-pay

## 2020-07-27 ENCOUNTER — Other Ambulatory Visit: Payer: Self-pay

## 2020-07-27 DIAGNOSIS — C44319 Basal cell carcinoma of skin of other parts of face: Secondary | ICD-10-CM

## 2020-07-27 NOTE — Telephone Encounter (Signed)
-----   Message from Ralene Bathe, MD sent at 07/23/2020  5:37 PM EST ----- Diagnosis Skin , right medial lower eyelid 1.0cm from medial canthus BASAL CELL CARCINOMA, SUPERFICIAL AND NODULAR PATTERNS  Cancer - BCC Schedule for [facility]

## 2020-07-27 NOTE — Telephone Encounter (Signed)
Discussed biopsy results with pt, pt would like to go to [facility] area for Mohs surgery, discussed with pt we will get his referral sent to Dr Manley Mason for Mohs surgery

## 2022-07-24 ENCOUNTER — Ambulatory Visit
Admission: EM | Admit: 2022-07-24 | Discharge: 2022-07-24 | Disposition: A | Discharge status: Home / Self Care | Payer: Medicaid Other | Attending: Urgent Care | Admitting: Urgent Care

## 2022-07-24 DIAGNOSIS — J069 Acute upper respiratory infection, unspecified: Secondary | ICD-10-CM

## 2022-07-24 MED ORDER — BENZONATATE 100 MG PO CAPS: ORAL_CAPSULE | ORAL | 0 refills | Status: DC

## 2022-07-24 MED ORDER — HYDROCOD POLI-CHLORPHE POLI ER 10-8 MG/5ML PO SUER: 5.0000 mL | Freq: Two times a day (BID) | ORAL | 0 refills | Status: AC | PRN

## 2022-07-24 NOTE — ED Provider Notes (Signed)
Nathan Hood    CSN: [id number] Arrival date & time: 07/24/22  6269      History   Chief Complaint Chief Complaint  Patient presents with   Cough   Nasal Congestion    HPI Nathan Hood is a 57 y.o. male.    Cough   This urgent care with symptoms x 4 days.  He reports cough and nasal congestion.  Also complains of headache which she states is induced by cough.  Endorses subjective fever a few days ago, now resolved.  Accompanied by body aches and chills.  Past Medical History:  Diagnosis Date   Anxiety    Arthritis    Asthma    Basal cell carcinoma (BCC) of lower eyelid 04/28/2020   right medial lower eyelid   Gallbladder disease    Head injury    Hiatal hernia    Sleep apnea     Patient Active Problem List   Diagnosis Date Noted   Cocaine use (see 10/28/2015 UDS) 11/08/2015   Controlled substance agreement terminated 11/08/2015   Arthropathy, traumatic, knee 10/07/2015   H/O total knee replacement 10/07/2015   Knee Arthralgia  (Bilateral) 06/17/2015   Long term prescription opiate use 05/20/2015   Chronic knee pain (Location of Primary Source of Pain) (Bilateral) (R>L) 05/20/2015   Bilateral knee osteoarthritis (Severe) (R>L) 05/20/2015   Chronic constipation 04/21/2015   Chronic low back pain 04/21/2015   Depression 04/21/2015   Knee Arthropathy (Bilateral) 04/21/2015   Encounter for therapeutic drug level monitoring 04/21/2015   Long term current use of opiate analgesic 48/54/6270   Uncomplicated opioid dependence ([place]) 04/21/2015   Opiate use (30 MME/Day) 04/21/2015   Chronic pain 04/21/2015    Past Surgical History:  Procedure Laterality Date   CHOLECYSTECTOMY     HERNIA REPAIR     x2   KNEE SURGERY Right        Home Medications    Prior to Admission medications   Medication Sig Start Date End Date Taking? Authorizing Provider  acetaminophen (TYLENOL) 500 MG tablet Take 1,000 mg by mouth 2 (two) times daily. Patient not  taking: Reported on 07/21/2020    [provider]  aspirin 81 MG tablet Take 81 mg by mouth daily. Patient not taking: Reported on 07/21/2020    [provider]  docusate sodium (COLACE) 100 MG capsule Take 100 mg by mouth 2 (two) times daily. Patient not taking: Reported on 07/21/2020    [provider]  fexofenadine (ALLEGRA) 180 MG tablet Take 180 mg by mouth daily. Reported on 11/04/2015 Patient not taking: Reported on 07/21/2020    [provider]  meloxicam (MOBIC) 15 MG tablet Take 15 mg by mouth daily. Patient not taking: Reported on 07/21/2020    [provider]  mupirocin ointment (BACTROBAN) 2 % Apply to affected area QD until healed. 07/21/20   Ralene Bathe, MD  oxyCODONE (OXY IR/ROXICODONE) 5 MG immediate release tablet Take 1 tablet (5 mg total) by mouth every 6 (six) hours as needed for moderate pain or severe pain. Patient not taking: Reported on 07/21/2020 10/28/15   Milinda Pointer, MD  oxyCODONE (OXY IR/ROXICODONE) 5 MG immediate release tablet Take 1 tablet (5 mg total) by mouth every 6 (six) hours as needed for moderate pain or severe pain. Patient not taking: Reported on 07/21/2020 10/28/15   Milinda Pointer, MD  oxyCODONE (OXY IR/ROXICODONE) 5 MG immediate release tablet Take 1 tablet (5 mg total) by mouth every 6 (six)  hours as needed for moderate pain or severe pain. Patient not taking: Reported on 07/21/2020 10/28/15   Milinda Pointer, MD  oxyCODONE-acetaminophen (PERCOCET) 5-325 MG tablet Take 1 tablet by mouth every 8 (eight) hours as needed for moderate pain or severe pain. Patient not taking: Reported on 07/21/2020 11/07/19   Earleen Newport, MD  polyethylene glycol [facility] / Floria Raveling) packet Take 17 g by mouth daily. Patient not taking: Reported on 07/21/2020    [provider]    Family History Family History  Problem Relation Age of Onset   COPD Mother    Arthritis Mother    Birth defects Maternal  Grandfather    Stroke Maternal Grandfather     Social History Social History   Tobacco Use   Smoking status: Never   Smokeless tobacco: Never  Substance Use Topics   Alcohol use: Yes    Alcohol/week: 6.0 standard drinks of alcohol    Types: 6 Standard drinks or equivalent per week   Drug use: No     Allergies   Patient has no known allergies.   Review of Systems Review of Systems  Respiratory:  Positive for cough.      Physical Exam Triage Vital Signs ED Triage Vitals  Enc Vitals Group     BP 07/24/22 1022 137/87     Pulse Rate 07/24/22 1022 92     Resp 07/24/22 1022 18     Temp 07/24/22 1022 99.4 F (37.4 C)     Temp Source 07/24/22 1022 Oral     SpO2 07/24/22 1022 94 %     Weight --      Height --      Head Circumference --      Peak Flow --      Pain Score 07/24/22 1021 0     Pain Loc --      Pain Edu? --      Excl. in [place]? --    No data found.  Updated Vital Signs BP 137/87 (BP Location: Left Arm)   Pulse 92   Temp 99.4 F (37.4 C) (Oral)   Resp 18   SpO2 94%   Visual Acuity Right Eye Distance:   Left Eye Distance:   Bilateral Distance:    Right Eye Near:   Left Eye Near:    Bilateral Near:     Physical Exam Vitals reviewed.  Constitutional:      Appearance: Normal appearance. He is ill-appearing.  Cardiovascular:     Rate and Rhythm: Normal rate and regular rhythm.     Pulses: Normal pulses.     Heart sounds: Normal heart sounds.  Pulmonary:     Effort: Pulmonary effort is normal.     Breath sounds: Normal breath sounds.  Skin:    General: Skin is warm and dry.  Neurological:     General: No focal deficit present.     Mental Status: He is alert and oriented to person, place, and time.  Psychiatric:        Mood and Affect: Mood normal.        Behavior: Behavior normal.      UC Treatments / Results  Labs (all labs ordered are listed, but only abnormal results are displayed) Labs Reviewed - No data to  display  EKG   Radiology No results found.  Procedures Procedures (including critical care time)  Medications Ordered in UC Medications - No data to display  Initial Impression / Assessment and Plan / UC  Course  I have reviewed the triage vital signs and the nursing notes.  Pertinent labs & imaging results that were available during my care of the patient were reviewed by me and considered in my medical decision making (see chart for details).   Patient is afebrile here without recent antipyretics. Satting 94% on room air. Overall is ill appearing, well hydrated, without respiratory distress. Pulmonary exam is unremarkable.  Lungs CTAB without wheezing, rhonchi, rales.  Symptoms are consistent with an acute viral process including flu or COVID.  He is outside of the treatment window for flu and given relatively mild symptoms, no antiviral for COVID would be indicated.  COVID swab declined.  Will prescribe medication to relieve his cough.  Given benzonatate.  Some concern for past opioid dependence which was in the context of his double knee replacement.  He is now free of this dependence.  Prescribing limited course of Tussionex.  Would not recommend refill.  Final Clinical Impressions(s) / UC Diagnoses   Final diagnoses:  None   Discharge Instructions   None    ED Prescriptions   None    PDMP not reviewed this encounter.   Rose Phi, [place] 07/24/22 1054

## 2022-07-24 NOTE — ED Triage Notes (Signed)
Pt. Presents to UC w/ c/oa cough and nasal congestion for the past 4 days. Pt. States his coughing induces headaches.

## 2022-07-24 NOTE — Discharge Instructions (Addendum)
You have been diagnosed with a viral upper respiratory infection based on your symptoms and exam. Viral illnesses cannot be treated with antibiotics - they are self limiting - and you should find your symptoms resolving within a few days. Get plenty of rest and non-caffeinated fluids. Watch for signs of dehydration including reduced urine output and dark colored urine.  We recommend you use over-the-counter medications for symptom control including acetaminophen (Tylenol), ibuprofen (Advil/Motrin) or naproxen (Aleve) for throat pain, fever, chills or body aches. You may combine use of acetaminophen and ibuprofen/naproxen if needed.  Some patients find an pain-relieving throat spray such as Chloraseptic to be effective.  Also recommend cold/cough medication containing a cough suppressant such as dextromethorphan, as needed. Please note that some cough medications are not recommended if you suffer from hypertension.  Cough medication, benzonatate, which is not sedating, and Tussionex, which is sedating, have been prescribed to treat your cough.  Saline mist spray is helpful for removing excess mucus from your nose.  Room humidifiers are helpful to ease breathing at night. I recommend guaifenesin (Mucinex) with plenty of water throughout the day to help thin and loosen mucus secretions in your respiratory passages.   If appropriate based upon your other medical problems, you might also find relief of nasal/sinus congestion symptoms by using a nasal decongestant such as fluticasone (Flonase ) or pseudoephedrine (Sudafed sinus).  You will need to obtain Sudafed from behind the pharmacist counter.  Speak to the pharmacist to verify that you are not duplicating medications with other over-the-counter formulations that you may be using.

## 2022-08-01 ENCOUNTER — Other Ambulatory Visit: Payer: Self-pay | Admitting: Internal Medicine

## 2022-08-02 LAB — LIPID PANEL W/O CHOL/HDL RATIO
Cholesterol, Total: 183 mg/dL (ref 100–199)
HDL: 64 mg/dL (ref >39)
LDL Chol Calc (NIH): 101 mg/dL — ABNORMAL HIGH (ref 0–99)
Triglycerides: 98 mg/dL (ref 0–149)
VLDL Cholesterol Cal: 18 mg/dL (ref 5–40)

## 2022-08-02 LAB — TSH: TSH: 2.42 u[IU]/mL (ref 0.450–4.500)

## 2022-10-31 ENCOUNTER — Encounter: Payer: Medicaid Other | Admitting: Internal Medicine

## 2023-01-27 ENCOUNTER — Encounter: Payer: Medicaid Other | Admitting: Internal Medicine

## 2023-05-05 ENCOUNTER — Ambulatory Visit: Payer: Medicaid Other | Admitting: Internal Medicine

## 2023-08-16 ENCOUNTER — Other Ambulatory Visit: Payer: Medicaid Other

## 2023-08-16 DIAGNOSIS — I10 Essential (primary) hypertension: Secondary | ICD-10-CM

## 2023-08-16 DIAGNOSIS — Z131 Encounter for screening for diabetes mellitus: Secondary | ICD-10-CM

## 2023-08-16 DIAGNOSIS — Z13220 Encounter for screening for lipoid disorders: Secondary | ICD-10-CM

## 2023-08-17 LAB — CMP14+EGFR
ALT: 31 [IU]/L (ref 0–44)
AST: 24 [IU]/L (ref 0–40)
Albumin: 4.4 g/dL (ref 3.8–4.9)
Alkaline Phosphatase: 72 [IU]/L (ref 44–121)
BUN/Creatinine Ratio: 15 (ref 9–20)
BUN: 14 mg/dL (ref 6–24)
Bilirubin Total: 0.6 mg/dL (ref 0.0–1.2)
CO2: 25 mmol/L (ref 20–29)
Calcium: 9.2 mg/dL (ref 8.7–10.2)
Chloride: 102 mmol/L (ref 96–106)
Creatinine, Ser: 0.94 mg/dL (ref 0.76–1.27)
Globulin, Total: 2.4 g/dL (ref 1.5–4.5)
Glucose: 113 mg/dL — ABNORMAL HIGH (ref 70–99)
Potassium: 4.9 mmol/L (ref 3.5–5.2)
Sodium: 140 mmol/L (ref 134–144)
Total Protein: 6.8 g/dL (ref 6.0–8.5)
eGFR: 96 mL/min/{1.73_m2} (ref >59)

## 2023-08-17 LAB — LIPID PANEL
Chol/HDL Ratio: 2.8 {ratio} (ref 0.0–5.0)
Cholesterol, Total: 160 mg/dL (ref 100–199)
HDL: 57 mg/dL (ref >39)
LDL Chol Calc (NIH): 86 mg/dL (ref 0–99)
Triglycerides: 90 mg/dL (ref 0–149)
VLDL Cholesterol Cal: 17 mg/dL (ref 5–40)

## 2023-08-17 LAB — HEMOGLOBIN A1C
Est. average glucose Bld gHb Est-mCnc: 114 mg/dL
Hgb A1c MFr Bld: 5.6 % (ref 4.8–5.6)

## 2023-08-23 ENCOUNTER — Ambulatory Visit (INDEPENDENT_AMBULATORY_CARE_PROVIDER_SITE_OTHER): Payer: Medicaid Other | Admitting: Internal Medicine

## 2023-08-23 ENCOUNTER — Encounter: Payer: Self-pay | Admitting: Internal Medicine

## 2023-08-23 VITALS — BP 141/95 | HR 81 | Temp 97.9°F | Ht 72.0 in | Wt 270.0 lb

## 2023-08-23 DIAGNOSIS — R972 Elevated prostate specific antigen [PSA]: Secondary | ICD-10-CM

## 2023-08-23 DIAGNOSIS — M79671 Pain in right foot: Secondary | ICD-10-CM

## 2023-08-23 DIAGNOSIS — M1710 Unilateral primary osteoarthritis, unspecified knee: Secondary | ICD-10-CM

## 2023-08-23 DIAGNOSIS — R7301 Impaired fasting glucose: Secondary | ICD-10-CM | POA: Diagnosis not present

## 2023-08-23 DIAGNOSIS — N400 Enlarged prostate without lower urinary tract symptoms: Secondary | ICD-10-CM | POA: Diagnosis not present

## 2023-08-23 DIAGNOSIS — I10 Essential (primary) hypertension: Secondary | ICD-10-CM | POA: Diagnosis not present

## 2023-08-23 DIAGNOSIS — M79672 Pain in left foot: Secondary | ICD-10-CM

## 2023-08-23 MED ORDER — AMLODIPINE-OLMESARTAN 5-40 MG PO TABS: 1.0000 | ORAL_TABLET | Freq: Every day | ORAL | 0 refills | Status: DC

## 2023-08-23 MED ORDER — CELECOXIB 200 MG PO CAPS: ORAL_CAPSULE | ORAL | 2 refills | Status: DC

## 2023-08-23 NOTE — Progress Notes (Signed)
Established Patient Office Visit  Subjective:  Patient ID: Nathan Hood, male    DOB: 1965/12/20  Age: 58 y.o. MRN: [id number]  Chief Complaint  Patient presents with   Follow-up    6 month follow up    No new complaints except chronic knee pain and worsening foot pain, here for lab review and medication refills. Gained weight due to dietary indiscretion. Labs reviewed and notable for well controlled lipids, elevated fasting glucose but normal A1c and unremarkable cmp.     No other concerns at this time.   Past Medical History:  Diagnosis Date   Anxiety    Arthritis    Asthma    Basal cell carcinoma (BCC) of lower eyelid 04/28/2020   right medial lower eyelid   Gallbladder disease    Head injury    Hiatal hernia    Sleep apnea     Past Surgical History:  Procedure Laterality Date   CHOLECYSTECTOMY     HERNIA REPAIR     x2   KNEE SURGERY Right     Social History   Socioeconomic History   Marital status: Single    Spouse name: Not on file   Number of children: Not on file   Years of education: Not on file   Highest education level: Not on file  Occupational History   Not on file  Tobacco Use   Smoking status: Never   Smokeless tobacco: Never  Substance and Sexual Activity   Alcohol use: Yes    Alcohol/week: 6.0 standard drinks of alcohol    Types: 6 Standard drinks or equivalent per week   Drug use: No   Sexual activity: Not on file  Other Topics Concern   Not on file  Social History Narrative   Not on file   Social Drivers of Health   Financial Resource Strain: Not on file  Food Insecurity: Not on file  Transportation Needs: Not on file  Physical Activity: Not on file  Stress: Not on file  Social Connections: Not on file  Intimate Partner Violence: Not on file    Family History  Problem Relation Age of Onset   COPD Mother    Arthritis Mother    Birth defects Maternal Grandfather    Stroke Maternal Grandfather     No Known  Allergies  Outpatient Medications Prior to Visit  Medication Sig   acetaminophen (TYLENOL) 500 MG tablet Take 1,000 mg by mouth 2 (two) times daily. (Patient not taking: Reported on 08/23/2023)   aspirin 81 MG tablet Take 81 mg by mouth daily. (Patient not taking: Reported on 08/23/2023)   docusate sodium (COLACE) 100 MG capsule Take 100 mg by mouth 2 (two) times daily. (Patient not taking: Reported on 08/23/2023)   fexofenadine (ALLEGRA) 180 MG tablet Take 180 mg by mouth daily. Reported on 11/04/2015 (Patient not taking: Reported on 08/23/2023)   meloxicam (MOBIC) 15 MG tablet Take 15 mg by mouth daily. (Patient not taking: Reported on 08/23/2023)   mupirocin ointment (BACTROBAN) 2 % Apply to affected area QD until healed. (Patient not taking: Reported on 08/23/2023)   polyethylene glycol (MIRALAX / GLYCOLAX) packet Take 17 g by mouth daily. (Patient not taking: Reported on 08/23/2023)   [DISCONTINUED] benzonatate (TESSALON) 100 MG capsule Take 1-2 tablets 3 times a day as needed for cough (Patient not taking: Reported on 08/23/2023)   No facility-administered medications prior to visit.    Review of Systems  Constitutional: Negative.  Negative for weight loss (  14 lbs).  HENT: Negative.    Eyes: Negative.   Respiratory: Negative.    Cardiovascular: Negative.   Gastrointestinal: Negative.   Genitourinary: Negative.   Musculoskeletal:  Positive for joint pain (knees and feet).  Skin: Negative.   Neurological: Negative.   Endo/Heme/Allergies: Negative.        Objective:   BP (!) 141/95   Pulse 81   Temp 97.9 F (36.6 C)   Ht 6' (1.829 m)   Wt 270 lb (122.5 kg)   SpO2 98%   BMI 36.62 kg/m   Vitals:   08/23/23 1011  BP: (!) 141/95  Pulse: 81  Temp: 97.9 F (36.6 C)  Height: 6' (1.829 m)  Weight: 270 lb (122.5 kg)  SpO2: 98%  BMI (Calculated): 36.61    Physical Exam Vitals reviewed.  Constitutional:      Appearance: Normal appearance. He is obese.  HENT:     Head:  Normocephalic.     Left Ear: There is no impacted cerumen.     Nose: Nose normal.     Mouth/Throat:     Mouth: Mucous membranes are moist.     Pharynx: No posterior oropharyngeal erythema.  Eyes:     Extraocular Movements: Extraocular movements intact.     Pupils: Pupils are equal, round, and reactive to light.  Cardiovascular:     Rate and Rhythm: Regular rhythm.     Chest Wall: PMI is not displaced.     Pulses: Normal pulses.     Heart sounds: Normal heart sounds. No murmur heard. Pulmonary:     Effort: Pulmonary effort is normal.     Breath sounds: Normal air entry. No rhonchi or rales.  Abdominal:     General: Abdomen is flat. Bowel sounds are normal. There is no distension.     Palpations: Abdomen is soft. There is no hepatomegaly, splenomegaly or mass.     Tenderness: There is no abdominal tenderness.  Musculoskeletal:        General: Normal range of motion.     Cervical back: Normal range of motion and neck supple.     Right lower leg: No edema.     Left lower leg: No edema.  Skin:    General: Skin is warm and dry.  Neurological:     General: No focal deficit present.     Mental Status: He is alert and oriented to person, place, and time.     Cranial Nerves: No cranial nerve deficit.     Motor: No weakness.  Psychiatric:        Mood and Affect: Mood normal.        Behavior: Behavior normal.      No results found for any visits on 08/23/23.  Recent Results (from the past 2160 hours)  CMP14+EGFR     Status: Abnormal   Collection Time: 08/16/23  1:08 PM  Result Value Ref Range   Glucose 113 (H) 70 - 99 mg/dL   BUN 14 6 - 24 mg/dL   Creatinine, Ser 1.61 0.76 - 1.27 mg/dL   eGFR 96 >09 UE/AVW/0.98   BUN/Creatinine Ratio 15 9 - 20   Sodium 140 134 - 144 mmol/L   Potassium 4.9 3.5 - 5.2 mmol/L   Chloride 102 96 - 106 mmol/L   CO2 25 20 - 29 mmol/L   Calcium 9.2 8.7 - 10.2 mg/dL   Total Protein 6.8 6.0 - 8.5 g/dL   Albumin 4.4 3.8 - 4.9 g/dL   Globulin, Total  2.4 1.5 - 4.5 g/dL   Bilirubin Total 0.6 0.0 - 1.2 mg/dL   Alkaline Phosphatase 72 44 - 121 IU/L   AST 24 0 - 40 IU/L   ALT 31 0 - 44 IU/L  Hemoglobin A1c     Status: None   Collection Time: 08/16/23  1:10 PM  Result Value Ref Range   Hgb A1c MFr Bld 5.6 4.8 - 5.6 %    Comment:          Prediabetes: 5.7 - 6.4          Diabetes: >6.4          Glycemic control for adults with diabetes: <7.0    Est. average glucose Bld gHb Est-mCnc 114 mg/dL  Lipid panel     Status: None   Collection Time: 08/16/23  1:10 PM  Result Value Ref Range   Cholesterol, Total 160 100 - 199 mg/dL   Triglycerides 90 0 - 149 mg/dL   HDL 57 >60 mg/dL   VLDL Cholesterol Cal 17 5 - 40 mg/dL   LDL Chol Calc (NIH) 86 0 - 99 mg/dL   Chol/HDL Ratio 2.8 0.0 - 5.0 ratio    Comment:                                   T. Chol/HDL Ratio                                             Men  Women                               1/2 Avg.Risk  3.4    3.3                                   Avg.Risk  5.0    4.4                                2X Avg.Risk  9.6    7.1                                3X Avg.Risk 23.4   11.0       Assessment & Plan:  As per problem list  Problem List Items Addressed This Visit       Musculoskeletal and Integument   Knee Arthropathy (Bilateral) (Chronic)   Relevant Medications   celecoxib (CELEBREX) 200 MG capsule   Other Visit Diagnoses       Primary hypertension    -  Primary   Relevant Medications   amLODipine-olmesartan (AZOR) 5-40 MG tablet   Other Relevant Orders   CBC With Diff/Platelet     BPH with elevated PSA       Relevant Orders   PSA     Elevated fasting glucose       Relevant Orders   Hemoglobin A1c     Foot pain, bilateral       Relevant Orders   Ambulatory referral to Podiatry       Return in about  3 months (around 11/20/2023) for cpe with labs prior.   Total time spent: 20 minutes  Luna Fuse, MD  08/23/2023   This document may have been prepared  by [facility] Voice Recognition software and as such may include unintentional dictation errors.

## 2023-09-13 ENCOUNTER — Encounter: Payer: Self-pay | Admitting: Podiatry

## 2023-09-13 ENCOUNTER — Ambulatory Visit: Admitting: Podiatry

## 2023-09-13 ENCOUNTER — Ambulatory Visit (INDEPENDENT_AMBULATORY_CARE_PROVIDER_SITE_OTHER)

## 2023-09-13 DIAGNOSIS — M722 Plantar fascial fibromatosis: Secondary | ICD-10-CM | POA: Diagnosis not present

## 2023-09-13 DIAGNOSIS — M19071 Primary osteoarthritis, right ankle and foot: Secondary | ICD-10-CM

## 2023-09-13 DIAGNOSIS — M19072 Primary osteoarthritis, left ankle and foot: Secondary | ICD-10-CM
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
## 2023-11-20 ENCOUNTER — Other Ambulatory Visit

## 2023-11-21 ENCOUNTER — Ambulatory Visit (INDEPENDENT_AMBULATORY_CARE_PROVIDER_SITE_OTHER): Payer: Medicaid Other | Admitting: Internal Medicine

## 2023-11-21 ENCOUNTER — Ambulatory Visit: Payer: Self-pay | Admitting: Internal Medicine

## 2023-11-21 VITALS — BP 124/68 | HR 99 | Temp 97.7°F | Ht 72.0 in | Wt 266.0 lb

## 2023-11-21 DIAGNOSIS — I10 Essential (primary) hypertension: Secondary | ICD-10-CM | POA: Diagnosis not present

## 2023-11-21 DIAGNOSIS — M1710 Unilateral primary osteoarthritis, unspecified knee: Secondary | ICD-10-CM

## 2023-11-21 DIAGNOSIS — D7589 Other specified diseases of blood and blood-forming organs: Secondary | ICD-10-CM

## 2023-11-21 DIAGNOSIS — E66811 Obesity, class 1: Secondary | ICD-10-CM | POA: Diagnosis not present

## 2023-11-21 LAB — PSA: Prostate Specific Ag, Serum: 0.4 ng/mL (ref 0.0–4.0)

## 2023-11-21 LAB — CBC WITH DIFF/PLATELET
Basophils Absolute: 0 10*3/uL (ref 0.0–0.2)
Basos: 0 %
EOS (ABSOLUTE): 0.3 10*3/uL (ref 0.0–0.4)
Eos: 3 %
Hematocrit: 48.6 % (ref 37.5–51.0)
Hemoglobin: 16.1 g/dL (ref 13.0–17.7)
Immature Grans (Abs): 0 10*3/uL (ref 0.0–0.1)
Immature Granulocytes: 1 %
Lymphocytes Absolute: 2.1 10*3/uL (ref 0.7–3.1)
Lymphs: 28 %
MCH: 33.3 pg — ABNORMAL HIGH (ref 26.6–33.0)
MCHC: 33.1 g/dL (ref 31.5–35.7)
MCV: 100 fL — ABNORMAL HIGH (ref 79–97)
Monocytes Absolute: 0.7 10*3/uL (ref 0.1–0.9)
Monocytes: 10 %
Neutrophils Absolute: 4.3 10*3/uL (ref 1.4–7.0)
Neutrophils: 58 %
Platelets: 292 10*3/uL (ref 150–450)
RBC: 4.84 x10E6/uL (ref 4.14–5.80)
RDW: 13.1 % (ref 11.6–15.4)
WBC: 7.4 10*3/uL (ref 3.4–10.8)

## 2023-11-21 LAB — HEMOGLOBIN A1C
Est. average glucose Bld gHb Est-mCnc: 111 mg/dL
Hgb A1c MFr Bld: 5.5 % (ref 4.8–5.6)

## 2023-11-21 MED ORDER — AMLODIPINE-OLMESARTAN 5-40 MG PO TABS: 1.0000 | ORAL_TABLET | Freq: Every day | ORAL | 0 refills | Status: DC

## 2023-11-21 MED ORDER — CELECOXIB 200 MG PO CAPS: ORAL_CAPSULE | ORAL | 2 refills | Status: AC

## 2023-11-21 MED ORDER — PHENDIMETRAZINE TARTRATE 35 MG PO TABS: 1.0000 | ORAL_TABLET | Freq: Three times a day (TID) | ORAL | 0 refills | Status: AC

## 2023-11-21 NOTE — Progress Notes (Signed)
 Established Patient Office Visit  Subjective:  Patient ID: Nathan Hood, male    DOB: December 04, 1965  Age: 58 y.o. MRN: [id number]  Chief Complaint  Patient presents with   Follow-up    3 Months Follow Up    No new complaints, here for lab review and medication refills. Labs notable for macrocytosis, psa and A1c normal.     No other concerns at this time.   Past Medical History:  Diagnosis Date   Anxiety    Arthritis    Asthma    Basal cell carcinoma (BCC) of lower eyelid 04/28/2020   right medial lower eyelid   Gallbladder disease    Head injury    Hiatal hernia    Sleep apnea     Past Surgical History:  Procedure Laterality Date   CHOLECYSTECTOMY     HERNIA REPAIR     x2   KNEE SURGERY Right     Social History   Socioeconomic History   Marital status: Single    Spouse name: Not on file   Number of children: Not on file   Years of education: Not on file   Highest education level: Not on file  Occupational History   Not on file  Tobacco Use   Smoking status: Never   Smokeless tobacco: Never  Substance and Sexual Activity   Alcohol use: Yes    Alcohol/week: 6.0 standard drinks of alcohol    Types: 6 Standard drinks or equivalent per week    Comment: weekends   Drug use: No   Sexual activity: Not on file  Other Topics Concern   Not on file  Social History Narrative   Not on file   Social Drivers of Health   Financial Resource Strain: Not on file  Food Insecurity: Not on file  Transportation Needs: Not on file  Physical Activity: Not on file  Stress: Not on file  Social Connections: Not on file  Intimate Partner Violence: Not on file    Family History  Problem Relation Age of Onset   COPD Mother    Arthritis Mother    Birth defects Maternal Grandfather    Stroke Maternal Grandfather     No Known Allergies  Outpatient Medications Prior to Visit  Medication Sig   acetaminophen  (TYLENOL ) 500 MG tablet Take 1,000 mg by mouth 2 (two)  times daily. (Patient not taking: Reported on 07/21/2020)   aspirin 81 MG tablet Take 81 mg by mouth daily. (Patient not taking: Reported on 07/21/2020)   docusate sodium (COLACE) 100 MG capsule Take 100 mg by mouth 2 (two) times daily. (Patient not taking: Reported on 07/21/2020)   fexofenadine (ALLEGRA) 180 MG tablet Take 180 mg by mouth daily. Reported on 11/04/2015 (Patient not taking: Reported on 07/21/2020)   mupirocin  ointment (BACTROBAN ) 2 % Apply to affected area QD until healed. (Patient not taking: Reported on 09/13/2023)   polyethylene glycol (MIRALAX / GLYCOLAX) packet Take 17 g by mouth daily. (Patient not taking: Reported on 07/21/2020)   [DISCONTINUED] amLODipine -olmesartan  (AZOR ) 5-40 MG tablet Take 1 tablet by mouth daily.   [DISCONTINUED] celecoxib  (CELEBREX ) 200 MG capsule Take 1-2 capsules daily as needed for joint pains   [DISCONTINUED] meloxicam  (MOBIC ) 15 MG tablet Take 15 mg by mouth daily. (Patient not taking: Reported on 07/21/2020)   No facility-administered medications prior to visit.    Review of Systems  Constitutional:  Positive for weight loss (4 lbs).  HENT: Negative.    Eyes: Negative.  Respiratory: Negative.    Cardiovascular: Negative.   Gastrointestinal: Negative.   Genitourinary: Negative.   Musculoskeletal:  Positive for joint pain (knees and feet).  Skin: Negative.   Neurological: Negative.   Endo/Heme/Allergies: Negative.        Objective:   BP 124/68   Pulse 99   Temp 97.7 F (36.5 C) (Tympanic)   Ht 6' (1.829 m)   Wt 266 lb (120.7 kg)   SpO2 96%   BMI 36.08 kg/m   Vitals:   11/21/23 1127  BP: 124/68  Pulse: 99  Temp: 97.7 F (36.5 C)  Height: 6' (1.829 m)  Weight: 266 lb (120.7 kg)  SpO2: 96%  TempSrc: Tympanic  BMI (Calculated): 36.07    Physical Exam Vitals reviewed.  Constitutional:      Appearance: Normal appearance. He is obese.  HENT:     Head: Normocephalic.     Left Ear: There is no impacted cerumen.     Nose:  Nose normal.     Mouth/Throat:     Mouth: Mucous membranes are moist.     Pharynx: No posterior oropharyngeal erythema.  Eyes:     Extraocular Movements: Extraocular movements intact.     Pupils: Pupils are equal, round, and reactive to light.  Cardiovascular:     Rate and Rhythm: Regular rhythm.     Chest Wall: PMI is not displaced.     Pulses: Normal pulses.     Heart sounds: Normal heart sounds. No murmur heard. Pulmonary:     Effort: Pulmonary effort is normal.     Breath sounds: Normal air entry. No rhonchi or rales.  Abdominal:     General: Abdomen is flat. Bowel sounds are normal. There is no distension.     Palpations: Abdomen is soft. There is no hepatomegaly, splenomegaly or mass.     Tenderness: There is no abdominal tenderness.  Musculoskeletal:        General: Normal range of motion.     Cervical back: Normal range of motion and neck supple.     Right lower leg: No edema.     Left lower leg: No edema.  Skin:    General: Skin is warm and dry.  Neurological:     General: No focal deficit present.     Mental Status: He is alert and oriented to person, place, and time.     Cranial Nerves: No cranial nerve deficit.     Motor: No weakness.  Psychiatric:        Mood and Affect: Mood normal.        Behavior: Behavior normal.      No results found for any visits on 11/21/23.  Recent Results (from the past 2160 hours)  CBC With Diff/Platelet     Status: Abnormal   Collection Time: 11/20/23 11:59 AM  Result Value Ref Range   WBC 7.4 3.4 - 10.8 x10E3/uL   RBC 4.84 4.14 - 5.80 x10E6/uL   Hemoglobin 16.1 13.0 - 17.7 g/dL   Hematocrit 78.2 95.6 - 51.0 %   MCV 100 (H) 79 - 97 fL   MCH 33.3 (H) 26.6 - 33.0 pg   MCHC 33.1 31.5 - 35.7 g/dL   RDW 21.3 08.6 - 57.8 %   Platelets 292 150 - 450 x10E3/uL   Neutrophils 58 Not Estab. %   Lymphs 28 Not Estab. %   Monocytes 10 Not Estab. %   Eos 3 Not Estab. %   Basos 0 Not Estab. %  Neutrophils Absolute 4.3 1.4 - 7.0  x10E3/uL   Lymphocytes Absolute 2.1 0.7 - 3.1 x10E3/uL   Monocytes Absolute 0.7 0.1 - 0.9 x10E3/uL   EOS (ABSOLUTE) 0.3 0.0 - 0.4 x10E3/uL   Basophils Absolute 0.0 0.0 - 0.2 x10E3/uL   Immature Granulocytes 1 Not Estab. %   Immature Grans (Abs) 0.0 0.0 - 0.1 x10E3/uL  PSA     Status: None   Collection Time: 11/20/23 11:59 AM  Result Value Ref Range   Prostate Specific Ag, Serum 0.4 0.0 - 4.0 ng/mL    Comment: Roche ECLIA methodology. According to the American U[employer] Association, Serum PSA should decrease and remain at undetectable levels after radical prostatectomy. The AUA defines biochemical recurrence as an initial PSA value 0.2 ng/mL or greater followed by a subsequent confirmatory PSA value 0.2 ng/mL or greater. Values obtained with different assay methods or kits cannot be used interchangeably. Results cannot be interpreted as absolute evidence of the presence or absence of malignant disease.   Hemoglobin A1c     Status: None   Collection Time: 11/20/23 11:59 AM  Result Value Ref Range   Hgb A1c MFr Bld 5.5 4.8 - 5.6 %    Comment:          Prediabetes: 5.7 - 6.4          Diabetes: >6.4          Glycemic control for adults with diabetes: <7.0    Est. average glucose Bld gHb Est-mCnc 111 mg/dL      Assessment & Plan:  As per problem list. Stricter low calorie diet, low cholesterol and low fat diet and exercise as much as possible.Macrocytosis fluctuates so suspect it'Haydin Dunn due to etoh.  Problem List Items Addressed This Visit       Musculoskeletal and Integument   Knee Arthropathy (Bilateral) (Chronic)   Relevant Medications   celecoxib  (CELEBREX ) 200 MG capsule   Other Visit Diagnoses       Obesity (BMI 30.0-34.9)    -  Primary   Relevant Medications   Phendimetrazine Tartrate 35 MG TABS     Primary hypertension       Relevant Medications   amLODipine -olmesartan  (AZOR ) 5-40 MG tablet       Return in about 1 month (around 12/22/2023).   Total time spent:  20 minutes  Arzella Bitters, MD  11/21/2023   This document may have been prepared by [facility] Voice Recognition software and as such may include unintentional dictation errors.

## 2023-12-02 ENCOUNTER — Other Ambulatory Visit: Payer: Self-pay | Admitting: Internal Medicine

## 2023-12-02 DIAGNOSIS — I10 Essential (primary) hypertension: Secondary | ICD-10-CM

## 2023-12-26 ENCOUNTER — Ambulatory Visit: Admitting: Internal Medicine

## 2024-07-16 ENCOUNTER — Ambulatory Visit: Admitting: Orthopaedic Surgery
